# Patient Record
Sex: Female | Born: 1970 | Race: Black or African American | Hispanic: No | Marital: Single | Smoking: Never smoker
Health system: Southern US, Community
[De-identification: ages and names within clinical notes are randomized; demographics above are authoritative.]

## PROBLEM LIST (undated history)

## (undated) DIAGNOSIS — E7800 Pure hypercholesterolemia, unspecified: Secondary | ICD-10-CM

## (undated) DIAGNOSIS — G43909 Migraine, unspecified, not intractable, without status migrainosus: Secondary | ICD-10-CM

## (undated) DIAGNOSIS — J40 Bronchitis, not specified as acute or chronic: Secondary | ICD-10-CM

## (undated) DIAGNOSIS — E559 Vitamin D deficiency, unspecified: Principal | ICD-10-CM

## (undated) DIAGNOSIS — M545 Low back pain: Secondary | ICD-10-CM

## (undated) HISTORY — DX: Vitamin D deficiency, unspecified: E55.9

## (undated) HISTORY — DX: Migraine, unspecified, not intractable, without status migrainosus: G43.909

## (undated) HISTORY — DX: Low back pain: M54.5

## (undated) HISTORY — PX: ENDOMETRIAL ABLATION: SHX621

## (undated) HISTORY — PX: TUBAL LIGATION: SHX77

## (undated) HISTORY — PX: SHOULDER SURGERY: SHX246

---

## 1998-07-03 ENCOUNTER — Other Ambulatory Visit: Admission: RE | Admit: 1998-07-03 | Discharge: 1998-07-03 | Payer: Self-pay | Admitting: Gynecology

## 2001-01-05 ENCOUNTER — Ambulatory Visit (HOSPITAL_COMMUNITY): Admission: RE | Admit: 2001-01-05 | Discharge: 2001-01-05 | Payer: Self-pay | Admitting: *Deleted

## 2001-06-06 ENCOUNTER — Encounter: Payer: Self-pay | Admitting: Family Medicine

## 2001-06-06 ENCOUNTER — Ambulatory Visit (HOSPITAL_COMMUNITY): Admission: RE | Admit: 2001-06-06 | Discharge: 2001-06-06 | Payer: Self-pay | Admitting: Family Medicine

## 2002-03-22 ENCOUNTER — Emergency Department (HOSPITAL_COMMUNITY): Admission: EM | Admit: 2002-03-22 | Discharge: 2002-03-22 | Payer: Self-pay | Admitting: Internal Medicine

## 2003-06-26 ENCOUNTER — Emergency Department (HOSPITAL_COMMUNITY): Admission: EM | Admit: 2003-06-26 | Discharge: 2003-06-26 | Payer: Self-pay | Admitting: Emergency Medicine

## 2003-07-31 ENCOUNTER — Ambulatory Visit (HOSPITAL_COMMUNITY): Admission: RE | Admit: 2003-07-31 | Discharge: 2003-07-31 | Payer: Self-pay | Admitting: Otolaryngology

## 2004-02-13 ENCOUNTER — Ambulatory Visit (HOSPITAL_COMMUNITY): Admission: RE | Admit: 2004-02-13 | Discharge: 2004-02-13 | Payer: Self-pay | Admitting: *Deleted

## 2004-08-25 ENCOUNTER — Emergency Department: Payer: Self-pay | Admitting: Emergency Medicine

## 2005-07-05 ENCOUNTER — Emergency Department (HOSPITAL_COMMUNITY): Admission: EM | Admit: 2005-07-05 | Discharge: 2005-07-05 | Payer: Self-pay | Admitting: Emergency Medicine

## 2005-07-06 ENCOUNTER — Ambulatory Visit (HOSPITAL_COMMUNITY): Admission: RE | Admit: 2005-07-06 | Discharge: 2005-07-06 | Payer: Self-pay | Admitting: Family Medicine

## 2007-08-24 ENCOUNTER — Ambulatory Visit (HOSPITAL_COMMUNITY): Admission: RE | Admit: 2007-08-24 | Discharge: 2007-08-24 | Payer: Self-pay | Admitting: Obstetrics and Gynecology

## 2007-11-28 ENCOUNTER — Ambulatory Visit (HOSPITAL_COMMUNITY): Admission: RE | Admit: 2007-11-28 | Discharge: 2007-11-28 | Payer: Self-pay | Admitting: Family Medicine

## 2008-01-16 ENCOUNTER — Other Ambulatory Visit: Admission: RE | Admit: 2008-01-16 | Discharge: 2008-01-16 | Payer: Self-pay | Admitting: Obstetrics and Gynecology

## 2008-01-26 ENCOUNTER — Ambulatory Visit (HOSPITAL_COMMUNITY): Admission: RE | Admit: 2008-01-26 | Discharge: 2008-01-26 | Payer: Self-pay | Admitting: Obstetrics and Gynecology

## 2009-06-04 ENCOUNTER — Emergency Department (HOSPITAL_COMMUNITY): Admission: EM | Admit: 2009-06-04 | Discharge: 2009-06-04 | Payer: Self-pay | Admitting: Emergency Medicine

## 2010-06-28 LAB — RAPID STREP SCREEN (MED CTR MEBANE ONLY): Streptococcus, Group A Screen (Direct): NEGATIVE

## 2010-08-11 ENCOUNTER — Other Ambulatory Visit: Payer: Self-pay | Admitting: Obstetrics and Gynecology

## 2010-08-11 DIAGNOSIS — Z139 Encounter for screening, unspecified: Secondary | ICD-10-CM

## 2010-08-18 ENCOUNTER — Encounter (HOSPITAL_COMMUNITY): Payer: Self-pay

## 2010-08-18 NOTE — H&P (Signed)
NAME:  Katherine [name], Katherine [name]                ACCOUNT NO.:  [id number]   MEDICAL RECORD NO.:  [id number]          PATIENT TYPE:  AMB   LOCATION:  DAY                           FACILITY:  [facility]   PHYSICIAN:  Tilda Burrow, M.D. DATE OF BIRTH:  1970/05/26   DATE OF ADMISSION:  DATE OF DISCHARGE:  LH                              HISTORY & PHYSICAL   ADMITTING DIAGNOSIS:  Desire for elective permanent sterilization.   HISTORY OF PRESENT ILLNESS:  This 40 year old female who was seen in our  office for care in May 2009 is requesting elective permanent  sterilization.  She is a former patient of Dr. Dorinda Hill P. Lisette Grinder, who  has had routine uncomplicated GYN care.  She has had irregular menses,  skipping months at a time.  Recently, she had a period that was  uncomfortable and her worst period ever.  She is infrequently sexually  active.  She has decided that she wishes to proceed with permanent  sterilization at this time.   PAST MEDICAL HISTORY:  None.   PAST SURGICAL HISTORY:  Negative.   OBSTETRIC HISTORY:  Gravida 1, para 1.   MEDICATIONS:  Lo/Ovral.   ALLERGIES:  None.   FAMILY HISTORY:  She is single.  She is a niece of Joslyn Devon [facility]  [place]).   PHYSICAL EXAMINATION:  Reveals a largely muscular African American  female, alert, and oriented x3.  Pupils are equal, round, and reactive.  Extraocular movements intact.  NECK:  Supple.  CARDIOVASCULAR:  Unremarkable.  ABDOMEN:  Nontender without masses.  EXTERNAL GENITALIA:  Multiparous without lesions or abnormalities.  Cervix normal.  Uterus, nontender and mobile.  No adnexal tenderness or  masses.  EXTREMITY:  Grossly normal.   ASSESSMENT:  Elective permanent sterilization.   PLAN:  Laparoscopic tubal sterilization, Falope-Rings on Aug 24, 2007.   ADDENDUM:  The patient's work is physically demanding and she will need  a note stating when she can return to work.  Tentatively, we will  schedule it a week away, for Tuesday  after [facility] Day, Aug 30, 2007, 5  days after surgery.      Tilda Burrow, M.D.  Electronically Signed     JVF/MEDQ  D:  08/22/2007  T:  08/23/2007  Job:  [id number]

## 2010-08-18 NOTE — Op Note (Signed)
Katherine [name], Katherine [name]                ACCOUNT NO.:  [id number]   MEDICAL RECORD NO.:  [id number]          PATIENT TYPE:  AMB   LOCATION:  DAY                           FACILITY:  [facility]   PHYSICIAN:  Tilda Burrow, M.D. DATE OF BIRTH:  Oct 22, 1970   DATE OF PROCEDURE:  01/26/2008  DATE OF DISCHARGE:  01/26/2008                               OPERATIVE REPORT   PREOPERATIVE DIAGNOSIS:  Dysmenorrhea.   POSTOPERATIVE DIAGNOSES:  Dysmenorrhea and cervical stenosis.   PROCEDURE:  Hysteroscopy, dilatation and curettage, and endometrial  ablation.   SURGEON:  Tilda Burrow, MD   ASSISTANT:  None.   ANESTHESIA:  General.   FINDINGS:  Stenotic cervix, short and small uterine size.   ESTIMATED BLOOD LOSS:  Minimal.   SPECIMEN:  None.   INDICATIONS:  A 40 year old female who is recently status post tubal  ligation with debilitating dysmenorrhea attributable to the cervical  stenosis, status post conization, worsened by the fact that we have now  tied her tubes.  She is no longer on birth control pills and therefore  having heavier menstrual flow.   DETAILS OF PROCEDURE:  The patient was taken to the operating room,  prepped and draped for vaginal procedure.  The cervix was grasped with a  single-tooth tenaculum and the uterus sounded to 7 cm.  Cervix was  dilated with some moderate difficulty due to stenosis to 23-French  allowing introduction of a rigid 30-degree hysteroscope, which confirmed  a smooth endometrial cavity contour.  Smooth and sharp curettage was  performed obtaining a small amount of tissue, not enough to give a  specimen.  The repeat hysteroscopy revealed that both tubal ostia could  be visualized.  There was no suspicion of perforation.   Gynecare ThermaChoice III endometrial ablation device was then prepped  and inserted into the endometrial cavity and the 8 minute thermal  sequence completed without difficulty.  The fluid was all returned  without any loss,  and the patient then went to recovery room in good  condition after completion of procedure and Marcaine paracervical block  applied.  Sponge and needle counts were correct.      Tilda Burrow, M.D.  Electronically Signed     JVF/MEDQ  D:  02/02/2008  T:  02/03/2008  Job:  [id number]

## 2010-08-18 NOTE — H&P (Signed)
NAME:  Katherine [name], Katherine [name]                ACCOUNT NO.:  [id number]   MEDICAL RECORD NO.:  [id number]          PATIENT TYPE:  AMB   LOCATION:  DAY                           FACILITY:  [facility]   PHYSICIAN:  Tilda Burrow, M.D. DATE OF BIRTH:  09-10-1970   DATE OF ADMISSION:  DATE OF DISCHARGE:  LH                              HISTORY & PHYSICAL   PREOPERATIVE DIAGNOSES:  1. Menorrhagia.  2. Dysmenorrhea.   POSTOPERATIVE DIAGNOSES:  1. Menorrhagia.  2. Dysmenorrhea.   HISTORY OF PRESENT ILLNESS:  This 40 year old female only a few months'  status post tubal ligation returned complaining of debilitating  dysmenorrhea or the menses, which are lasting only about 5 days.  This  is interfering with her work.  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  She is aware of the 90% success  rate and 10% failure rate with endometrial ablation.   PAST MEDICAL HISTORY:  Essentially unchanged from her recent tubal  ligation.   REVIEW OF SYSTEMS:  Positive for chronic postprandial nausea and lactose  intolerance.   PAST MEDICAL HISTORY:  Benign.   PAST SURGICAL HISTORY:  Conization of the cervix.   Gravida 1, para 1, status post tubal ligation, single.   PHYSICAL EXAMINATION:  GENERAL:  Weight 180.4, blood pressure 110/72.  HEENT:  Pupils equal, round, and reactive.  Extraocular movements  intact.  NECK:  Supple.  Chest:  Clear to auscultation.  ABDOMEN:  Nontender.  PELVIC:  External genitalia with multiparous cervix status post laser.  PAP smear performed.   PLAN:  Hysteroscopy, dilation and curettage, endometrial ablation on  January 26, 2008 at 7:30 a.m. at [facility].      Tilda Burrow, M.D.  Electronically Signed     JVF/MEDQ  D:  01/24/2008  T:  01/25/2008  Job:  [id number]

## 2010-08-18 NOTE — Op Note (Signed)
NAME:  Katherine [name], Katherine [name]                ACCOUNT NO.:  [id number]   MEDICAL RECORD NO.:  [id number]          PATIENT TYPE:  AMB   LOCATION:  DAY                           FACILITY:  [facility]   PHYSICIAN:  Tilda Burrow, M.D. DATE OF BIRTH:  [birth year]   DATE OF PROCEDURE:  08/24/2007  DATE OF DISCHARGE:                               OPERATIVE REPORT   PREOPERATIVE DIAGNOSIS:  Elective sterilization.   POSTOPERATIVE DIAGNOSIS:  Elective sterilization.   PROCEDURE:  Laparoscopic tubal sterilization with Falope rings.   SURGEON:  Tilda Burrow, MD   ASSISTANT:  None.   ANESTHESIA:  General.   COMPLICATIONS:  None.   FINDINGS:  Normal tubes and ovaries.  Photo documentation performed.   DETAILS OF PROCEDURE:  The patient was taken to the operating room,  prepped and draped in the usual standard fashion with legs in low  lithotomy leg supports after general anesthesia was introduced without  difficulty.  The bladder was in-and-out catheterized and Hulka tenaculum  attached to the cervix for uterine manipulation.  An infraumbilical,  vertical, 1-cm skin incision was made as well as a transverse suprapubic  1-cm incision.  A Veress needle was used to achieve pneumoperitoneum  through the umbilical incision while being careful to orient the needle  toward the pelvis while elevating the abdominal wall by manual  elevation.  Water droplet test was used to confirm intraperitoneal  placement.   Pneumoperitoneum was achieved easily under 8-to-10 mm of intra-abdominal  pressure; and the laparoscopic trocar was introduced, a 5-mm blunt  tipped trocar, under direct visualization using the video camera.  Peritoneal cavity was entered without difficulty.  Inspection of the  anterior surfaces of the abdominal contents showed no evidence of injury  or bleeding.  Attention was directed to the pelvis.  Findings were as  described above.   Attention was first directed to the left fallopian tube  which was  elevated, identified to its fimbriated end and grasped in its midportion  with Falope ring applier.  Falope ring applied and then the tube  infiltrated with Marcaine solution 0.25% using a 22-gauge spinal needle  percutaneously applied.   Attention was then directed to the right fallopian tube where a similar  procedure was performed.  Photo documentation of the ring placements was  performed; 120 cc of saline was instilled into the abdomen; deflation of  CO2 performed; instruments removed and subcuticular 4-0 Dexon closure of  skin incisions performed.  The rest of the surgical instruments were  removed; Steri-Strips placed.  The patient allowed to awaken and go to  recovery room in standard fashion.      Tilda Burrow, M.D.  Electronically Signed     JVF/MEDQ  D:  08/24/2007  T:  08/25/2007  Job:  [id number]

## 2010-08-21 NOTE — Op Note (Signed)
[facility]  Patient:    Katherine [name], Katherine [name] Visit Number: [id number] MRN: [id number]          Service Type: DSU Location: DAY Attending Physician:  Jeri Cos. Dictated by:   Langley Gauss, M.D. Proc. Date: 01/05/01 Admit Date:  01/05/2001 Discharge Date: 01/05/2001                             Operative Report  PREOPERATIVE DIAGNOSIS:  Abnormal uterine bleeding despite continuous oral contraceptives.  POSTOPERATIVE DIAGNOSIS:  Abnormal uterine bleeding despite continuous oral contraceptives.  PROCEDURE PERFORMED: 1. Hysteroscopy. 2. Dilatation and curettage.  SURGEON:  Langley Gauss, M.D.  COMPLICATIONS:  None.  SPECIMENS:  Separate specimens obtained, #1 from the hysteroscopy and then #2 is uterine curettings obtained during dilatation and curettage.  ESTIMATED BLOOD LOSS:  Less than 500 cc.  ANESTHESIA:  General endotracheal.  FINDINGS:  Findings at time of surgery include enlarged uterus sounding to a depth of 10 cm.  Present at the uterine fundus is very spongy-appearing tissue with no evidence of hemorrhage.  Portion of this is removed utilizing the biopsy forceps through the hysteroscope.  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  There is also noted to be what appears to be endocervical polyps which likewise should have been removed during the dilatation and curettage.  DESCRIPTION OF PROCEDURE:  Patient was taken to the operating room.  Vital signs were stable.  Patient underwent an uncomplicated induction of general endotracheal anesthesia, after which time she was placed in the full lithotomy position.  She was prepped and draped in the usual sterile manner.  Red rubber catheter was used to drain about 50 cc of clear-yellow urine from the bladder. A speculum was placed within the vaginal vault.  Cervix  was visualized. Calgon solution is used to sterilely prep the cervix directly.  The uterus, as stated previously, is noted to sound to a depth of about 10 cm.  Initial resistance was encountered in the lower uterine segment which released with gentle pressure.  There was no evidence of any uterine perforation during any portion of the operative procedure.  As stated previously, uterus is noted to sound to a depth of 10 cm.  Gentle progressive dilatation is then performed up to a size #16 dilator, which allows passage of the hysteroscope and sleeve through the endocervical os and into the uterine cavity itself.  Careful inspection of the entire uterine cavity is performed at this time.  As stated previously, several small endocervical polyps are visualized which are photographed.  Likewise, the spongy-appearing non-hemorrhagic tissue at the uterine fundus is photographed.  A portion of this spongy-appearing tissue is then excised utilizing the hysteroscopic biopsy forceps.  This is handed off for separate specimen.  The remainder of the intrauterine contour appears normal with no evidence of additional synechiae, nor are there any septations encountered nor any irregularities which would be consistent with any fibroid disease, thus the hysteroscopic portion of the procedure is terminated.  The hysteroscope is removed.  With the cervix already being dilated from the hysteroscopic procedure, the small banjo curette is passed through the endocervix into the uterine cavity.  Curettage is then performed of the entire uterine cavity to include the uterine fundus.  Specific attention is made to the area of the uterine fundus where the spongy-appearing tissue had  been visualized through the hysteroscope.  Curettage is continued in all four walls of the uterus as well as the uterine fundus until a fine gritty sensation is appreciated in all areas of the uterine wall.  A moderate amount of  what appears to be normal tissue is obtained and no hemorrhage is encountered. There is no evidence of any septations within the uterus or any further synechiae encountered.  The specimen is then handed off for a permanent specimen only.  The procedure is then terminated.  Procedure was tolerated very well by the patient.  Patient was reversed of anesthesia and taken to recovery room in stable condition.  Operative findings discussed with the patients awaiting family.  PLAN:  Discharge the patient today on outpatient surgical status. Dictated by:   Langley Gauss, M.D. Attending Physician:  Jeri Cos. DD:  01/10/01 TD:  01/11/01 Job: [id number]

## 2010-08-21 NOTE — H&P (Signed)
[facility]  Patient:    Katherine [name], Katherine [name] Visit Number: [id number] MRN: [id number]          Service Type: Attending:  Langley Gauss, M.D. Dictated by:   Langley Gauss, M.D. Adm. Date:  01/05/01                           History and Physical  REASON FOR ADMISSION:  Patient admitted for [facility] and hysteroscopy for evaluation of abnormal uterine bleeding.  HISTORY OF PRESENT ILLNESS:  The patient is a 40 year old gravida 1, para 1 whose is a postpartum OB on June 17, 2000.  The delivery itself was uncomplicated.  Patient states that since delivery of that infant, she has had problems with irregular menstrual period; most specifically, she has had passage of small blood clots and intermittent spotting.  Patient does state that most of the other times of the month, she has persistent brownish discharge.  She has been treated with multiple courses of antibiotics, most specifically Flagyl and MetroGel; likewise, she has been changed on birth control pills, has taken Lo/Ovral most recently.  Despite taking the pills on a daily basis at the same time, she has had problems with the irregular brown discharge.  Cultures have likewise been negative.  PAST MEDICAL HISTORY:  Patient had previous upper GI study and does experience intermittent reflux symptoms.  Patient does have a history of laser surgery performed on the cervix in 1996 or 1997.  Patient is a carrier of a sickle cell trait.  Patient is postpartum OB on June 16, 2000, no postpartum endometritis encountered.  ALLERGIES:  Patient has no known drug allergies.  CURRENT MEDICATIONS:  Lo/Ovral on a daily basis.  Patient has recently completed a course of Flagyl.  PHYSICAL EXAMINATION:  GENERAL:  Very healthy, pleasant black female in no acute distress.  VITAL SIGNS:  Blood pressure 122/82.  Weight is 160 pounds.  HEENT:  Negative.  No adenopathy.  NECK:  Supple.  Thyroid is nonpalpable.  LUNGS:   Clear.  CARDIOVASCULAR:  Regular rate and rhythm.  ABDOMEN:  Soft and nontender.  No surgical scars are identified.  EXTREMITIES:  Normal.  PELVIC:  Normal external genitalia.  The cervix is visualized and noted to be without lesions.  There is a brownish mucusy discharge present at the endocervical os.  No evidence of any cervicitis.  Bimanual examination reveals a normal-size uterus which is retroflexed.  There are no adnexal masses.  The uterus and adnexa are palpably normal and nontender.  LABORATORY STUDIES:  A transvaginal ultrasound is performed which does reveal a thickened, hypoechogenic material within the endometrial cavity with a maximum diameter of 0.73 cm.  The ovaries are otherwise visualized and noted to be normal in appearance, measured at 2.98 cm and 1.98 cm on each side.  ASSESSMENT:  Patient with difficulty with abnormal uterine bleeding and most specifically a malodorous persistent brown vaginal discharge following delivery.  She has failed to have significant improvement on birth control pills, despite taking them as prescribed.  She has had some relief from the odor associated with the discharge after just recently completing a course of p.o. Flagyl, however, the brown discharge does persist and is somewhat disconcerting to the patient; likewise, culture for a sexually transmitted disease has been negative.  Impression is possible chronic endometriosis but in consideration of the abnormal appearance of the ultrasound with the thickening of the endometrial stripe, the patient has a good possibility  of a placental polyp or some chronic retained placental products.  Thus, at this point in time, patient is referred for dilatation and curettage and hysteroscopy for complete evaluation of the uterine cavity.  Risks and benefits of the procedure are discussed with the patient, specifically the risks associated with the anesthesia; also, patient accepts that there  are risks associated with possible perforation of the uterus or chronic infection persisting following operative procedure. Dictated by:   Langley Gauss, M.D. Attending:  Langley Gauss, M.D. DD:  01/04/01 TD:  01/05/01 Job: [id number]

## 2010-08-21 NOTE — Discharge Summary (Signed)
[facility]  Patient:    [name], Katherine Visit Number: [id number] MRN: [id number]          Service Type: DSU Location: DAY Attending Physician:  Jeri Cos. Dictated by:   Langley Gauss, M.D. Admit Date:  01/05/2001 Discharge Date: 01/05/2001                             Discharge Summary  DISPOSITION:  Patient is given a copy of standardized discharge instructions. Follow up in the office in one weeks time.  DISCHARGE MEDICATIONS:  Doxycycline 100 mg p.o. b.i.d. x 7 days, likewise, patient received 1 g of IV Ancef intraoperatively, patient also given a prescription for Vicoprofen #20 with no refill, to be taken 1 q.6h. p.r.n. for postoperative uterine cramping.  PERTINENT LABORATORY STUDIES:  HCG negative.  B-positive blood type. Hemoglobin 12.6, hematocrit 35.9.  Electrolytes within normal limits.  HOSPITAL COURSE:  Patient processed through the outpatient surgical unit on January 05, 2001, taken to the operating room where the dilatation and curettage and hysteroscopy were performed without complications.  Patient did well postoperatively with no postoperative complications, had minimal vaginal bleeding, patient thus discharged home on January 05, 2001.  She will continue with the oral contraceptives and follow up in the office in one weeks time, at which time we should have pathology results available and can discuss additional operative findings with the patient. Dictated by:   Langley Gauss, M.D. Attending Physician:  Jeri Cos. DD:  01/10/01 TD:  01/11/01 Job: [id number]

## 2010-08-25 ENCOUNTER — Ambulatory Visit (HOSPITAL_COMMUNITY)
Admission: RE | Admit: 2010-08-25 | Discharge: 2010-08-25 | Disposition: A | Discharge status: Home / Self Care | Payer: Self-pay | Source: Ambulatory Visit | Attending: Obstetrics and Gynecology | Admitting: Obstetrics and Gynecology

## 2010-08-25 DIAGNOSIS — Z139 Encounter for screening, unspecified: Secondary | ICD-10-CM

## 2010-09-23 ENCOUNTER — Other Ambulatory Visit (HOSPITAL_COMMUNITY)
Admission: RE | Admit: 2010-09-23 | Discharge: 2010-09-23 | Disposition: A | Discharge status: Home / Self Care | Payer: Self-pay | Source: Ambulatory Visit | Attending: Obstetrics and Gynecology | Admitting: Obstetrics and Gynecology

## 2010-09-23 ENCOUNTER — Other Ambulatory Visit: Payer: Self-pay | Admitting: Obstetrics and Gynecology

## 2010-09-23 DIAGNOSIS — Z01419 Encounter for gynecological examination (general) (routine) without abnormal findings: Secondary | ICD-10-CM | POA: Insufficient documentation

## 2010-12-30 LAB — BASIC METABOLIC PANEL
BUN: 7
Calcium: 9.7
Chloride: 105
Creatinine, Ser: 1.09
GFR calc Af Amer: >60
GFR calc non Af Amer: 56 — ABNORMAL LOW
Sodium: 137

## 2010-12-30 LAB — CBC: MCV: 88.9

## 2011-01-04 LAB — CBC
Hemoglobin: 12.3
Platelets: 251
WBC: 6.9

## 2011-01-04 LAB — HCG, QUANTITATIVE, PREGNANCY: hCG, Beta Chain, Quant, S: 2

## 2011-09-15 ENCOUNTER — Other Ambulatory Visit (HOSPITAL_COMMUNITY): Payer: Self-pay | Admitting: Family Medicine

## 2011-09-15 DIAGNOSIS — Z139 Encounter for screening, unspecified: Secondary | ICD-10-CM

## 2011-09-20 ENCOUNTER — Other Ambulatory Visit (HOSPITAL_COMMUNITY): Payer: Self-pay | Admitting: Family Medicine

## 2011-09-20 DIAGNOSIS — Z139 Encounter for screening, unspecified: Secondary | ICD-10-CM

## 2011-09-21 ENCOUNTER — Ambulatory Visit (HOSPITAL_COMMUNITY)
Admission: RE | Admit: 2011-09-21 | Discharge: 2011-09-21 | Disposition: A | Discharge status: Home / Self Care | Payer: Medicaid Other | Source: Ambulatory Visit | Attending: Family Medicine | Admitting: Family Medicine

## 2011-09-21 DIAGNOSIS — Z1231 Encounter for screening mammogram for malignant neoplasm of breast: Secondary | ICD-10-CM | POA: Insufficient documentation

## 2011-09-21 DIAGNOSIS — Z139 Encounter for screening, unspecified: Secondary | ICD-10-CM

## 2012-01-15 ENCOUNTER — Emergency Department (HOSPITAL_COMMUNITY)
Admission: EM | Admit: 2012-01-15 | Discharge: 2012-01-15 | Disposition: A | Discharge status: Home / Self Care | Payer: Medicaid Other | Attending: Emergency Medicine | Admitting: Emergency Medicine

## 2012-01-15 ENCOUNTER — Emergency Department (HOSPITAL_COMMUNITY): Payer: Medicaid Other

## 2012-01-15 ENCOUNTER — Encounter (HOSPITAL_COMMUNITY): Payer: Self-pay | Admitting: *Deleted

## 2012-01-15 DIAGNOSIS — R0789 Other chest pain: Secondary | ICD-10-CM

## 2012-01-15 DIAGNOSIS — J40 Bronchitis, not specified as acute or chronic: Secondary | ICD-10-CM

## 2012-01-15 DIAGNOSIS — J189 Pneumonia, unspecified organism: Secondary | ICD-10-CM | POA: Insufficient documentation

## 2012-01-15 HISTORY — DX: Bronchitis, not specified as acute or chronic: J40

## 2012-01-15 MED ORDER — HYDROCODONE-ACETAMINOPHEN 5-325 MG PO TABS: 1.0000 | ORAL_TABLET | Freq: Four times a day (QID) | ORAL | Status: AC | PRN

## 2012-01-15 MED ORDER — ALBUTEROL SULFATE HFA 108 (90 BASE) MCG/ACT IN AERS: 1.0000 | INHALATION_SPRAY | Freq: Four times a day (QID) | RESPIRATORY_TRACT | Status: DC | PRN

## 2012-01-15 MED ORDER — NAPROXEN 500 MG PO TABS: 500.0000 mg | ORAL_TABLET | Freq: Two times a day (BID) | ORAL | Status: DC

## 2012-01-15 MED ORDER — HYDROCODONE-ACETAMINOPHEN 5-325 MG PO TABS
1.0000 | ORAL_TABLET | Freq: Once | ORAL | Status: AC
  Administered 2012-01-15: 1 via ORAL
  Filled 2012-01-15: qty 1

## 2012-01-15 MED ORDER — MUCINEX DM 30-600 MG PO TB12: 1.0000 | ORAL_TABLET | Freq: Two times a day (BID) | ORAL | Status: DC

## 2012-01-15 MED ORDER — AZITHROMYCIN 250 MG PO TABS: 250.0000 mg | ORAL_TABLET | Freq: Every day | ORAL | Status: DC

## 2012-01-15 NOTE — ED Notes (Signed)
Pt with SOB with exertion.

## 2012-01-15 NOTE — ED Notes (Signed)
Pt with with cough, chest congestion for 2 weeks, productive cough of green phlegm and blood noted x 1, harder to breathe per pt, fever x 1 per pt

## 2012-01-15 NOTE — ED Provider Notes (Signed)
History  This chart was scribed for Katherine Jakes, MD by Shari Heritage. The patient was seen in room APA07/APA07. Patient's care was started at 1315.    CSN: [id number]  Arrival date & time 01/15/12  1245   First MD Initiated Contact with Patient 01/15/12 1315      Chief Complaint  Patient presents with  . Cough  . Nasal Congestion  . Chest Pain     Patient is a 41 y.o. female presenting with cough. The history is provided by the patient. No language interpreter was used.  Cough The current episode started more than 1 week ago. The problem occurs constantly. The cough is productive of sputum (green with streaks of blood). There has been no fever. Associated symptoms include chest pain. Pertinent negatives include no headaches. She has tried cough syrup for the symptoms. Her past medical history does not include asthma.    HPI Comments: Katherine [name] is a 41 y.o. female who presents to the Emergency Department complaining of productive cough onset 2 weeks ago and sudden, moderate, constant right-sided chest pain that began last night. Patient states that she has been coughing up green phlegm with some streaks of blood. There is associated chest congestion and fever x1 (1 week ago). Patient denies other symptoms at this time. Patient hasn't sought any other treatment for her symptoms. She has taken Alka Seltzer Cold and Robitussin with no relief. Patient hasn't taken Mucinex DM, Motrin or Naprosyn for pain relief. Patient has no history of asthma. Patient had shoulder surgery 3 weeks ago. Patient has never smoked.  PCP - Renard Matter   Past Medical History  Diagnosis Date  . Bronchitis     Past Surgical History  Procedure Date  . Shoulder surgery     No family history on file.  History  Substance Use Topics  . Smoking status: Never Smoker   . Smokeless tobacco: Not on file  . Alcohol Use: No    OB History    Grav Para Term Preterm Abortions TAB SAB Ect Mult Living               Review of Systems  Constitutional: Positive for fever (1 week ago).  HENT: Positive for congestion.   Eyes: Negative for visual disturbance.  Respiratory: Positive for cough.   Cardiovascular: Positive for chest pain.  Gastrointestinal: Negative for abdominal pain.  Genitourinary: Negative for difficulty urinating.  Musculoskeletal: Negative for back pain.  Skin: Negative for rash.  Neurological: Negative for headaches.    Allergies  Review of patient's allergies indicates no known allergies.  Home Medications   Current Outpatient Rx  Name Route Sig Dispense Refill  . PHENYLEPH-CPM-DM-ASPIRIN 7.11-04-08-325 MG PO TBEF Oral Take 2 tablets by mouth every 6 (six) hours as needed.    . ALBUTEROL SULFATE HFA 108 (90 BASE) MCG/ACT IN AERS Inhalation Inhale 1-2 puffs into the lungs every 6 (six) hours as needed for wheezing. 1 Inhaler 0  . AZITHROMYCIN 250 MG PO TABS Oral Take 1 tablet (250 mg total) by mouth daily. Take first 2 tablets together, then 1 every day until finished. 6 tablet 0  . MUCINEX DM 30-600 MG PO TB12 Oral Take 1 tablet by mouth 2 (two) times daily. 14 each 0  . HYDROCODONE-ACETAMINOPHEN 5-325 MG PO TABS Oral Take 1-2 tablets by mouth every 6 (six) hours as needed for pain. 14 tablet 0  . NAPROXEN 500 MG PO TABS Oral Take 1 tablet (500 mg total) by mouth  2 (two) times daily. 14 tablet 0    BP 112/85  Pulse 78  Temp 98.7 F (37.1 C) (Oral)  Resp 20  Ht 5\' 7"  (1.702 m)  Wt 190 lb (86.183 kg)  BMI 29.76 kg/m2  SpO2 100%  Physical Exam  Constitutional: She is oriented to person, place, and time. She appears well-developed and well-nourished.  HENT:  Head: Normocephalic and atraumatic.  Mouth/Throat: Oropharynx is clear and moist and mucous membranes are normal.       Throat is clear. Mucous membranes are moist.  Eyes: EOM are normal. Pupils are equal, round, and reactive to light.  Neck: Normal range of motion. Neck supple.  Cardiovascular: Normal  rate and regular rhythm.   No murmur heard. Pulmonary/Chest: Effort normal and breath sounds normal. No respiratory distress. She has no wheezes. She has no rales.       Tender to palpation under the right rest on the ribs.  Abdominal: She exhibits no distension. There is no rebound.  Musculoskeletal: Normal range of motion.  Lymphadenopathy:    She has no cervical adenopathy.  Neurological: She is alert and oriented to person, place, and time. She has normal strength. No sensory deficit. Coordination normal.  Skin: Skin is warm and dry.  Psychiatric: She has a normal mood and affect. Her behavior is normal.    ED Course  Procedures (including critical care time) DIAGNOSTIC STUDIES: Oxygen Saturation is 100% on room air, normal by my interpretation.    COORDINATION OF CARE: 1:54pm- Patient informed of current plan for treatment and evaluation and agrees with plan at this time.    Labs Reviewed - No data to display  Dg Chest 2 View  01/15/2012  *RADIOLOGY REPORT*  Clinical Data: Productive cough.  Fever.  CHEST - 2 VIEW  Comparison: None.  Findings: Pulmonary air space disease is seen in the superior segment of the left lower lobe, consistent with pneumonia.  The right lung is clear.  No evidence of pleural effusion.  Heart size is normal.  No hilar or mediastinal masses identified.  IMPRESSION: The superior left lower lobe airspace disease, consistent with pneumonia.   Original Report Authenticated By: Danae Orleans, M.D.      1. Community acquired pneumonia   2. Bronchitis   3. Chest wall pain       MDM  Chest x-rays consistent with a left-sided pneumonia patient with right-sided chest wall pain probably due to the cough suspect symptoms started out as a viral bronchitis patient does have a persistent cough. Not febrile currently no evidence of pneumothorax we'll treat with Zithromax for the left-sided community-acquired pneumonia we'll treat the chest wall pain now with  hydrocodone and Naprosyn also for the cough have the patient use albuterol inhaler and Mucinex DM. He and      I personally performed the services described in this documentation, which was scribed in my presence. The recorded information has been reviewed and considered.     Katherine Jakes, MD 01/15/12 [phone]

## 2012-01-20 ENCOUNTER — Other Ambulatory Visit (HOSPITAL_COMMUNITY): Payer: Self-pay | Admitting: Family Medicine

## 2012-01-20 ENCOUNTER — Ambulatory Visit (HOSPITAL_COMMUNITY)
Admission: RE | Admit: 2012-01-20 | Discharge: 2012-01-20 | Disposition: A | Discharge status: Home / Self Care | Payer: Medicaid Other | Source: Ambulatory Visit | Attending: Family Medicine | Admitting: Family Medicine

## 2012-01-20 DIAGNOSIS — R05 Cough: Secondary | ICD-10-CM | POA: Insufficient documentation

## 2012-01-20 DIAGNOSIS — R059 Cough, unspecified: Secondary | ICD-10-CM | POA: Insufficient documentation

## 2012-01-20 DIAGNOSIS — J189 Pneumonia, unspecified organism: Secondary | ICD-10-CM

## 2012-01-27 ENCOUNTER — Ambulatory Visit (HOSPITAL_COMMUNITY)
Admission: RE | Admit: 2012-01-27 | Discharge: 2012-01-27 | Disposition: A | Discharge status: Home / Self Care | Payer: Medicaid Other | Source: Ambulatory Visit | Attending: Family Medicine | Admitting: Family Medicine

## 2012-01-27 ENCOUNTER — Other Ambulatory Visit (HOSPITAL_COMMUNITY): Payer: Self-pay | Admitting: Family Medicine

## 2012-01-27 DIAGNOSIS — J189 Pneumonia, unspecified organism: Secondary | ICD-10-CM

## 2012-02-11 ENCOUNTER — Other Ambulatory Visit (HOSPITAL_COMMUNITY): Payer: Self-pay | Admitting: Family Medicine

## 2012-02-11 ENCOUNTER — Ambulatory Visit (HOSPITAL_COMMUNITY)
Admission: RE | Admit: 2012-02-11 | Discharge: 2012-02-11 | Disposition: A | Discharge status: Home / Self Care | Payer: Medicaid Other | Source: Ambulatory Visit | Attending: Family Medicine | Admitting: Family Medicine

## 2012-02-11 DIAGNOSIS — J189 Pneumonia, unspecified organism: Secondary | ICD-10-CM

## 2012-07-26 ENCOUNTER — Other Ambulatory Visit (HOSPITAL_COMMUNITY): Payer: Self-pay | Admitting: Family Medicine

## 2012-07-26 ENCOUNTER — Ambulatory Visit (HOSPITAL_COMMUNITY)
Admission: RE | Admit: 2012-07-26 | Discharge: 2012-07-26 | Disposition: A | Discharge status: Home / Self Care | Payer: Medicaid Other | Source: Ambulatory Visit | Attending: Family Medicine | Admitting: Family Medicine

## 2012-07-26 DIAGNOSIS — A159 Respiratory tuberculosis unspecified: Secondary | ICD-10-CM

## 2012-07-26 DIAGNOSIS — Z111 Encounter for screening for respiratory tuberculosis: Secondary | ICD-10-CM | POA: Insufficient documentation

## 2013-01-31 ENCOUNTER — Other Ambulatory Visit (HOSPITAL_COMMUNITY): Payer: Self-pay | Admitting: Family Medicine

## 2013-01-31 DIAGNOSIS — Z139 Encounter for screening, unspecified: Secondary | ICD-10-CM

## 2013-02-09 ENCOUNTER — Ambulatory Visit (HOSPITAL_COMMUNITY)
Admission: RE | Admit: 2013-02-09 | Discharge: 2013-02-09 | Disposition: A | Discharge status: Home / Self Care | Payer: Medicaid Other | Source: Ambulatory Visit | Attending: Family Medicine | Admitting: Family Medicine

## 2013-02-09 DIAGNOSIS — Z139 Encounter for screening, unspecified: Secondary | ICD-10-CM

## 2013-02-09 DIAGNOSIS — Z1231 Encounter for screening mammogram for malignant neoplasm of breast: Secondary | ICD-10-CM | POA: Insufficient documentation

## 2013-04-24 ENCOUNTER — Ambulatory Visit (HOSPITAL_COMMUNITY)
Admission: RE | Admit: 2013-04-24 | Discharge: 2013-04-24 | Disposition: A | Discharge status: Home / Self Care | Payer: Medicaid Other | Source: Ambulatory Visit | Attending: Family Medicine | Admitting: Family Medicine

## 2013-04-24 ENCOUNTER — Other Ambulatory Visit (HOSPITAL_COMMUNITY): Payer: Self-pay | Admitting: Family Medicine

## 2013-04-24 DIAGNOSIS — M545 Low back pain, unspecified: Secondary | ICD-10-CM

## 2013-04-24 DIAGNOSIS — Z139 Encounter for screening, unspecified: Secondary | ICD-10-CM

## 2013-08-01 ENCOUNTER — Other Ambulatory Visit (HOSPITAL_COMMUNITY): Payer: Self-pay | Admitting: Family Medicine

## 2013-08-01 ENCOUNTER — Ambulatory Visit (HOSPITAL_COMMUNITY)
Admission: RE | Admit: 2013-08-01 | Discharge: 2013-08-01 | Disposition: A | Discharge status: Home / Self Care | Payer: Medicaid Other | Source: Ambulatory Visit | Attending: Family Medicine | Admitting: Family Medicine

## 2013-08-01 DIAGNOSIS — Z111 Encounter for screening for respiratory tuberculosis: Secondary | ICD-10-CM | POA: Insufficient documentation

## 2013-08-01 DIAGNOSIS — A159 Respiratory tuberculosis unspecified: Secondary | ICD-10-CM

## 2014-02-21 ENCOUNTER — Other Ambulatory Visit (HOSPITAL_COMMUNITY): Payer: Self-pay | Admitting: Family Medicine

## 2014-02-21 DIAGNOSIS — Z139 Encounter for screening, unspecified: Secondary | ICD-10-CM

## 2014-02-25 ENCOUNTER — Ambulatory Visit (HOSPITAL_COMMUNITY)
Admission: RE | Admit: 2014-02-25 | Discharge: 2014-02-25 | Disposition: A | Discharge status: Home / Self Care | Payer: Medicaid Other | Source: Ambulatory Visit | Attending: Family Medicine | Admitting: Family Medicine

## 2014-02-25 DIAGNOSIS — Z1231 Encounter for screening mammogram for malignant neoplasm of breast: Secondary | ICD-10-CM | POA: Insufficient documentation

## 2014-02-25 DIAGNOSIS — Z139 Encounter for screening, unspecified: Secondary | ICD-10-CM

## 2015-02-24 ENCOUNTER — Ambulatory Visit (HOSPITAL_COMMUNITY)
Admission: RE | Admit: 2015-02-24 | Discharge: 2015-02-24 | Disposition: A | Discharge status: Home / Self Care | Payer: Medicaid Other | Source: Ambulatory Visit | Attending: Family Medicine | Admitting: Family Medicine

## 2015-02-24 ENCOUNTER — Other Ambulatory Visit (HOSPITAL_COMMUNITY): Payer: Self-pay | Admitting: Family Medicine

## 2015-02-24 DIAGNOSIS — R110 Nausea: Secondary | ICD-10-CM

## 2015-02-24 DIAGNOSIS — R51 Headache: Secondary | ICD-10-CM | POA: Insufficient documentation

## 2015-02-24 DIAGNOSIS — Z8669 Personal history of other diseases of the nervous system and sense organs: Secondary | ICD-10-CM

## 2015-02-24 DIAGNOSIS — R519 Headache, unspecified: Secondary | ICD-10-CM

## 2015-02-25 ENCOUNTER — Other Ambulatory Visit (HOSPITAL_COMMUNITY): Payer: Self-pay | Admitting: Family Medicine

## 2015-02-25 DIAGNOSIS — Z1231 Encounter for screening mammogram for malignant neoplasm of breast: Secondary | ICD-10-CM

## 2015-03-03 ENCOUNTER — Ambulatory Visit (HOSPITAL_COMMUNITY)
Admission: RE | Admit: 2015-03-03 | Discharge: 2015-03-03 | Disposition: A | Discharge status: Home / Self Care | Payer: Medicaid Other | Source: Ambulatory Visit | Attending: Family Medicine | Admitting: Family Medicine

## 2015-03-03 DIAGNOSIS — Z1231 Encounter for screening mammogram for malignant neoplasm of breast: Secondary | ICD-10-CM | POA: Diagnosis present

## 2015-05-28 ENCOUNTER — Other Ambulatory Visit (HOSPITAL_COMMUNITY)
Admission: RE | Admit: 2015-05-28 | Discharge: 2015-05-28 | Disposition: A | Discharge status: Home / Self Care | Payer: Medicaid Other | Source: Ambulatory Visit | Attending: Adult Health | Admitting: Adult Health

## 2015-05-28 ENCOUNTER — Ambulatory Visit (INDEPENDENT_AMBULATORY_CARE_PROVIDER_SITE_OTHER): Payer: Medicaid Other | Admitting: Adult Health

## 2015-05-28 ENCOUNTER — Encounter: Payer: Self-pay | Admitting: Adult Health

## 2015-05-28 VITALS — BP 122/90 | HR 70 | Ht 67.0 in | Wt 198.0 lb

## 2015-05-28 DIAGNOSIS — Z01419 Encounter for gynecological examination (general) (routine) without abnormal findings: Secondary | ICD-10-CM | POA: Insufficient documentation

## 2015-05-28 DIAGNOSIS — Z124 Encounter for screening for malignant neoplasm of cervix: Secondary | ICD-10-CM

## 2015-05-28 DIAGNOSIS — Z1212 Encounter for screening for malignant neoplasm of rectum: Secondary | ICD-10-CM | POA: Diagnosis not present

## 2015-05-28 DIAGNOSIS — M545 Low back pain, unspecified: Secondary | ICD-10-CM

## 2015-05-28 DIAGNOSIS — Z1151 Encounter for screening for human papillomavirus (HPV): Secondary | ICD-10-CM | POA: Diagnosis present

## 2015-05-28 DIAGNOSIS — Z0000 Encounter for general adult medical examination without abnormal findings: Secondary | ICD-10-CM | POA: Diagnosis not present

## 2015-05-28 HISTORY — DX: Low back pain, unspecified: M54.50

## 2015-05-28 LAB — POCT URINALYSIS DIPSTICK
Blood, UA: NEGATIVE
GLUCOSE UA: NEGATIVE
LEUKOCYTES UA: NEGATIVE
Nitrite, UA: NEGATIVE
PROTEIN UA: NEGATIVE

## 2015-05-28 LAB — HEMOCCULT GUIAC POC 1CARD (OFFICE): FECAL OCCULT BLD: NEGATIVE

## 2015-05-28 MED ORDER — CYCLOBENZAPRINE HCL 5 MG PO TABS: 5.0000 mg | ORAL_TABLET | Freq: Three times a day (TID) | ORAL | Status: DC | PRN

## 2015-05-28 NOTE — Patient Instructions (Signed)
Try flexeril for muscle cramps Try ice Try  Stretches Mammogram yearly Colonoscopy at 49 Follow up with Dr Selena Batten on back

## 2015-05-28 NOTE — Progress Notes (Signed)
Patient ID: Katherine [name], female   DOB: [birth year], 45 y.o.   MRN: [id number] History of Present Illness: Katherine [name] is a 45 year old black female, G2P1 in for a well woman gyn exam and pap, and complains of cramping pain in left lower back, no known injury, works as Chief Operating Officer.She says it comes and goes. PCP is Dr Selena Batten.No periods, sp ablation and no sex since 2010.   Current Medications, Allergies, Past Medical History, Past Surgical History, Family History and Social History were reviewed in [employer] record.     Review of Systems: Patient denies any headaches, hearing loss, fatigue, blurred vision, shortness of breath, chest pain, abdominal pain, problems with bowel movements, urination, or intercourse(not having sex). No joint pain or mood swings. See HPI for positives.   Physical Exam:BP 122/90 mmHg  Pulse 70  Ht  (1.702 m)  Wt 198 lb (89.812 kg)  BMI 31.00 kg/m2 urine negative General:  Well developed, well nourished, no acute distress Skin:  Warm and dry Neck:  Midline trachea, normal thyroid, good ROM, no lymphadenopathy Lungs; Clear to auscultation bilaterally Breast:  No dominant palpable mass, retraction, or nipple discharge Cardiovascular: Regular rate and rhythm Abdomen:  Soft, non tender, no hepatosplenomegaly Pelvic:  External genitalia is normal in appearance, no lesions.  The vagina is normal in appearance. Urethra has no lesions or masses. The cervix is bulbous, pap with HPV performed.  Uterus is felt to be normal size, shape, and contour.  No adnexal masses or tenderness noted.Bladder is non tender, no masses felt.No CVAT, but has pain left lower back like muscle spasm, when palpated Rectal: Good sphincter tone, no polyps, or hemorrhoids felt.  Hemoccult negative. Extremities/musculoskeletal:  No swelling or varicosities noted, no clubbing or cyanosis Psych:  No mood changes, alert and cooperative,seems happy   Impression: Well woman gyn  exam and pap Left lower back pain    Plan: Rx flexeril 5 mg #30 take 1 every 8 hours prn muscle cramps, no refills Try ice and stretches and tennis ball, Follow up with Dr Selena Batten if not better  Check CBC,CMP,TSH and lipids,A1c and vitamin D Physical in 1 year, pap in 3 if normal Mammogram yearly Colonosocpy at 49

## 2015-05-29 LAB — COMPREHENSIVE METABOLIC PANEL
ALK PHOS: 109 IU/L (ref 39–117)
ALT: 20 IU/L (ref 0–32)
AST: 18 IU/L (ref 0–40)
Albumin/Globulin Ratio: 1.7 (ref 1.1–2.5)
Albumin: 4.3 g/dL (ref 3.5–5.5)
BILIRUBIN TOTAL: 0.3 mg/dL (ref 0.0–1.2)
BUN / CREAT RATIO: 7 — AB (ref 9–23)
BUN: 7 mg/dL (ref 6–24)
CHLORIDE: 104 mmol/L (ref 96–106)
CO2: 23 mmol/L (ref 18–29)
CREATININE: 0.94 mg/dL (ref 0.57–1.00)
Calcium: 9.4 mg/dL (ref 8.7–10.2)
GFR calc Af Amer: 85 mL/min/{1.73_m2} (ref >59)
GFR calc non Af Amer: 74 mL/min/{1.73_m2} (ref >59)
GLUCOSE: 98 mg/dL (ref 65–99)
Globulin, Total: 2.5 g/dL (ref 1.5–4.5)
Potassium: 4.7 mmol/L (ref 3.5–5.2)
Sodium: 141 mmol/L (ref 134–144)
Total Protein: 6.8 g/dL (ref 6.0–8.5)

## 2015-05-29 LAB — TSH: TSH: 1.64 u[IU]/mL (ref 0.450–4.500)

## 2015-05-29 LAB — VITAMIN D 25 HYDROXY (VIT D DEFICIENCY, FRACTURES): VIT D 25 HYDROXY: 15.5 ng/mL — AB (ref 30.0–100.0)

## 2015-05-29 LAB — CBC
Hematocrit: 36.5 % (ref 34.0–46.6)
Hemoglobin: 12.5 g/dL (ref 11.1–15.9)
MCH: 30.2 pg (ref 26.6–33.0)
MCHC: 34.2 g/dL (ref 31.5–35.7)
MCV: 88 fL (ref 79–97)
PLATELETS: 337 10*3/uL (ref 150–379)
RBC: 4.14 x10E6/uL (ref 3.77–5.28)
RDW: 13.1 % (ref 12.3–15.4)
WBC: 8 10*3/uL (ref 3.4–10.8)

## 2015-05-29 LAB — LIPID PANEL
CHOLESTEROL TOTAL: 170 mg/dL (ref 100–199)
Chol/HDL Ratio: 5.2 ratio units — ABNORMAL HIGH (ref 0.0–4.4)
HDL: 33 mg/dL — AB (ref >39)
LDL Calculated: 113 mg/dL — ABNORMAL HIGH (ref 0–99)
TRIGLYCERIDES: 122 mg/dL (ref 0–149)
VLDL Cholesterol Cal: 24 mg/dL (ref 5–40)

## 2015-05-29 LAB — HEMOGLOBIN A1C
Est. average glucose Bld gHb Est-mCnc: 111 mg/dL
HEMOGLOBIN A1C: 5.5 % (ref 4.8–5.6)

## 2015-06-02 ENCOUNTER — Telehealth: Payer: Self-pay | Admitting: Adult Health

## 2015-06-02 ENCOUNTER — Encounter: Payer: Self-pay | Admitting: Adult Health

## 2015-06-02 DIAGNOSIS — E559 Vitamin D deficiency, unspecified: Secondary | ICD-10-CM

## 2015-06-02 HISTORY — DX: Vitamin D deficiency, unspecified: E55.9

## 2015-06-02 LAB — CYTOLOGY - PAP

## 2015-06-02 MED ORDER — CHOLECALCIFEROL 125 MCG (5000 UT) PO CAPS: 5000.0000 [IU] | ORAL_CAPSULE | Freq: Every day | ORAL | Status: DC

## 2015-06-02 NOTE — Telephone Encounter (Signed)
Left message about labs, needs to take vitamin D3 5000 IU per day and increase exercise and decrease carbs and fats

## 2015-06-17 ENCOUNTER — Ambulatory Visit (HOSPITAL_COMMUNITY)
Admission: RE | Admit: 2015-06-17 | Discharge: 2015-06-17 | Disposition: A | Discharge status: Home / Self Care | Payer: Medicaid Other | Source: Ambulatory Visit | Attending: Internal Medicine | Admitting: Internal Medicine

## 2015-06-17 ENCOUNTER — Other Ambulatory Visit (HOSPITAL_COMMUNITY): Payer: Self-pay | Admitting: Internal Medicine

## 2015-06-17 DIAGNOSIS — Z111 Encounter for screening for respiratory tuberculosis: Secondary | ICD-10-CM | POA: Diagnosis not present

## 2016-01-27 ENCOUNTER — Ambulatory Visit (INDEPENDENT_AMBULATORY_CARE_PROVIDER_SITE_OTHER): Payer: BC Managed Care – PPO | Admitting: Orthopaedic Surgery

## 2016-01-27 VITALS — Ht 67.0 in | Wt 191.0 lb

## 2016-01-27 DIAGNOSIS — G8929 Other chronic pain: Secondary | ICD-10-CM

## 2016-01-27 DIAGNOSIS — M25562 Pain in left knee: Secondary | ICD-10-CM | POA: Diagnosis not present

## 2016-01-27 DIAGNOSIS — M25561 Pain in right knee: Secondary | ICD-10-CM

## 2016-01-27 NOTE — Progress Notes (Signed)
   Office Visit Note   Patient: Katherine [name]           Date of Birth: [birth year]           MRN: [id number] Visit Date: 01/27/2016              Requested by: Butch PennyAngus McInnis, MD [address] [place], [place] [id number] PCP: Pearson GrippeJames Kim, MD   Assessment & Plan: Visit Diagnoses: No diagnosis found.  Plan: Doing well.  Follow-up as needed at this point.  Steroid injections in her knees next if needed in the winter.  Did well with the Monvisc  Follow-Up Instructions: No Follow-up on file.   Orders:  No orders of the defined types were placed in this encounter.  No orders of the defined types were placed in this encounter.     Procedures: No procedures performed   Clinical Data: No additional findings.   Subjective: Chief Complaint  Patient presents with  . Right Knee - Pain  . Left Knee - Pain    Pt is s/p Monovisc injections bilat knees Right knee in June and left knee in August. Post injection pt states that she is feeling pretty with the exception of some start up stiffness after prolonged rest. The pt states that it is a " pinch" feeling that she has with increased activity. Rest helps with this does not need rx for this. Happy about the resolved sewelling and overall feeling better since injection    Review of Systems   Objective: Vital Signs: There were no vitals taken for this visit.  Physical Exam  Ortho Exam No knee effusions. Great ROM Stable   Specialty Comments:  No specialty comments available.  Imaging: No results found.   PMFS History: Patient Active Problem List   Diagnosis Date Noted  . Vitamin D deficiency 06/02/2015  . Low back pain without sciatica 05/28/2015   Past Medical History:  Diagnosis Date  . Bronchitis   . Low back pain without sciatica 05/28/2015  . Migraines   . Vitamin D deficiency 06/02/2015    Family History  Problem Relation Age of Onset  . Dementia Mother   . Cancer Father     colon, kidney  . Hypertension  Sister   . Other Son     frequent ear infections; optical nerve and optical cup enlarged  . Dementia Maternal Grandmother   . Alcohol abuse Maternal Grandfather   . Leukemia Paternal Grandmother   . Cancer Paternal Grandfather     lung  . Alcohol abuse Paternal Grandfather     Past Surgical History:  Procedure Laterality Date  . ENDOMETRIAL ABLATION    . SHOULDER SURGERY    . TUBAL LIGATION     Social History   Occupational History  . Not on file.   Social History Main Topics  . Smoking status: Never Smoker  . Smokeless tobacco: Never Used  . Alcohol use No  . Drug use: No  . Sexual activity: Not Currently    Birth control/ protection: Surgical     Comment: tubal

## 2016-02-09 ENCOUNTER — Other Ambulatory Visit (HOSPITAL_COMMUNITY): Payer: Self-pay | Admitting: Internal Medicine

## 2016-02-09 DIAGNOSIS — Z1231 Encounter for screening mammogram for malignant neoplasm of breast: Secondary | ICD-10-CM

## 2016-03-04 ENCOUNTER — Ambulatory Visit (HOSPITAL_COMMUNITY): Payer: Medicaid Other

## 2016-03-04 ENCOUNTER — Ambulatory Visit (HOSPITAL_COMMUNITY)
Admission: RE | Admit: 2016-03-04 | Discharge: 2016-03-04 | Disposition: A | Discharge status: Home / Self Care | Payer: BC Managed Care – PPO | Source: Ambulatory Visit | Attending: Internal Medicine | Admitting: Internal Medicine

## 2016-03-04 ENCOUNTER — Other Ambulatory Visit (HOSPITAL_COMMUNITY): Payer: Self-pay | Admitting: Internal Medicine

## 2016-03-04 DIAGNOSIS — Z1231 Encounter for screening mammogram for malignant neoplasm of breast: Secondary | ICD-10-CM | POA: Diagnosis present

## 2016-03-10 ENCOUNTER — Emergency Department (HOSPITAL_COMMUNITY)
Admission: EM | Admit: 2016-03-10 | Discharge: 2016-03-10 | Disposition: A | Discharge status: Home / Self Care | Payer: BC Managed Care – PPO | Attending: Emergency Medicine | Admitting: Emergency Medicine

## 2016-03-10 ENCOUNTER — Encounter (HOSPITAL_COMMUNITY): Payer: Self-pay | Admitting: Emergency Medicine

## 2016-03-10 DIAGNOSIS — R5383 Other fatigue: Secondary | ICD-10-CM | POA: Insufficient documentation

## 2016-03-10 DIAGNOSIS — M549 Dorsalgia, unspecified: Secondary | ICD-10-CM | POA: Insufficient documentation

## 2016-03-10 DIAGNOSIS — R0602 Shortness of breath: Secondary | ICD-10-CM | POA: Diagnosis not present

## 2016-03-10 DIAGNOSIS — Z79899 Other long term (current) drug therapy: Secondary | ICD-10-CM | POA: Diagnosis not present

## 2016-03-10 DIAGNOSIS — R350 Frequency of micturition: Secondary | ICD-10-CM | POA: Diagnosis not present

## 2016-03-10 DIAGNOSIS — R200 Anesthesia of skin: Secondary | ICD-10-CM | POA: Insufficient documentation

## 2016-03-10 DIAGNOSIS — R531 Weakness: Secondary | ICD-10-CM | POA: Diagnosis not present

## 2016-03-10 DIAGNOSIS — R112 Nausea with vomiting, unspecified: Secondary | ICD-10-CM | POA: Insufficient documentation

## 2016-03-10 LAB — CBG MONITORING, ED: Glucose-Capillary: 90 mg/dL (ref 65–99)

## 2016-03-10 LAB — CBC WITH DIFFERENTIAL/PLATELET
BASOS PCT: 0 %
Basophils Absolute: 0 10*3/uL (ref 0.0–0.1)
EOS ABS: 0 10*3/uL (ref 0.0–0.7)
Eosinophils Relative: 0 %
HCT: 37.6 % (ref 36.0–46.0)
Hemoglobin: 12.7 g/dL (ref 12.0–15.0)
Lymphocytes Relative: 12 %
Lymphs Abs: 1 10*3/uL (ref 0.7–4.0)
MCH: 29.7 pg (ref 26.0–34.0)
MCHC: 33.8 g/dL (ref 30.0–36.0)
MCV: 87.9 fL (ref 78.0–100.0)
MONO ABS: 0.5 10*3/uL (ref 0.1–1.0)
Monocytes Relative: 6 %
Neutro Abs: 6.9 10*3/uL (ref 1.7–7.7)
Neutrophils Relative %: 82 %
Platelets: 239 10*3/uL (ref 150–400)
RBC: 4.28 MIL/uL (ref 3.87–5.11)
RDW: 12.9 % (ref 11.5–15.5)
WBC: 8.3 10*3/uL (ref 4.0–10.5)

## 2016-03-10 LAB — URINALYSIS, ROUTINE W REFLEX MICROSCOPIC
BILIRUBIN URINE: NEGATIVE
Glucose, UA: NEGATIVE mg/dL
HGB URINE DIPSTICK: NEGATIVE
KETONES UR: NEGATIVE mg/dL
Leukocytes, UA: NEGATIVE
NITRITE: NEGATIVE
PH: 8 (ref 5.0–8.0)
Protein, ur: NEGATIVE mg/dL
Specific Gravity, Urine: 1.005 (ref 1.005–1.030)

## 2016-03-10 LAB — BASIC METABOLIC PANEL
Anion gap: 6 (ref 5–15)
BUN: 13 mg/dL (ref 6–20)
CALCIUM: 9.2 mg/dL (ref 8.9–10.3)
CO2: 24 mmol/L (ref 22–32)
CREATININE: 0.99 mg/dL (ref 0.44–1.00)
Chloride: 106 mmol/L (ref 101–111)
GFR calc non Af Amer: >60 mL/min (ref >60)
Glucose, Bld: 106 mg/dL — ABNORMAL HIGH (ref 65–99)
Potassium: 3.4 mmol/L — ABNORMAL LOW (ref 3.5–5.1)
SODIUM: 136 mmol/L (ref 135–145)

## 2016-03-10 MED ORDER — ONDANSETRON HCL 4 MG/2ML IJ SOLN
4.0000 mg | Freq: Once | INTRAMUSCULAR | Status: AC
  Administered 2016-03-10: 4 mg via INTRAVENOUS
  Filled 2016-03-10: qty 2

## 2016-03-10 MED ORDER — ONDANSETRON HCL 4 MG PO TABS: 4.0000 mg | ORAL_TABLET | Freq: Three times a day (TID) | ORAL | 0 refills | Status: DC | PRN

## 2016-03-10 MED ORDER — SODIUM CHLORIDE 0.9 % IV BOLUS (SEPSIS)
1000.0000 mL | Freq: Once | INTRAVENOUS | Status: AC
  Administered 2016-03-10: 1000 mL via INTRAVENOUS

## 2016-03-10 NOTE — ED Triage Notes (Signed)
Having fatigue ofr months.  Feeling tired, weak, SOB at times and occasional pain to pain in shoulder blades and have numbness and tingling to both haves after falling asleep.

## 2016-03-10 NOTE — ED Provider Notes (Signed)
[facility]-EMERGENCY DEPT Provider Note   CSN: [id number] Arrival date & time: 03/10/16  [id number]  By signing my name below, I, Katherine [name], attest that this documentation has been prepared under the direction and in the presence of Katherine MemosJason Makana Feigel, MD. Electronically Signed: Placido SouLogan [name], ED Scribe. 03/10/16. 9:35 AM.   History   Chief Complaint No chief complaint on file.  HPI HPI Comments: Katherine [name] is a 45 y.o. female who presents to the Emergency Department complaining of persistent vomiting onset last night. Pt states she began experiencing her vomiting with associated finger numbness, upper back cramping, weakness, fatigue, increased urinary frequency, dry mouth, and SOB. Her last episode of vomiting was ~4 hours ago. She states that the numbness in her fingers, fatigue and weakness has been an ongoing issue. Pt states she takes propanolol, lovastatin and yesterday was started on baby aspirin by her PCP. She additionally reports chronic knee pain which she denies has acutely worsened. 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 She denies any other family members who have been sick recently. She is having regular BMs. Pt reports a FMHx of DM. She denies abdominal pain, diarrhea, fever, chills, dysuria, rash, difficulty urinating or other associated symptoms at this time.   The history is provided by the patient and medical records. No language interpreter was used.    Past Medical History:  Diagnosis Date  . Bronchitis   . Low back pain without sciatica 05/28/2015  . Migraines   . Vitamin D deficiency 06/02/2015    Patient Active Problem List   Diagnosis Date Noted  . Vitamin D deficiency 06/02/2015  . Low back pain without sciatica 05/28/2015    Past Surgical History:  Procedure Laterality Date  . ENDOMETRIAL ABLATION    . SHOULDER SURGERY    . TUBAL LIGATION      OB History    Gravida Para Term Preterm AB Living   2 1     1 1    SAB TAB Ectopic  Multiple Live Births     1             Home Medications    Prior to Admission medications   Medication Sig Start Date End Date Taking? Authorizing Provider  Cholecalciferol 5000 units capsule Take 1 capsule (5,000 Units total) by mouth daily. 06/02/15   Adline PotterJennifer A Griffin, NP  cyclobenzaprine (FLEXERIL) 5 MG tablet Take 1 tablet (5 mg total) by mouth 3 (three) times daily as needed for muscle spasms. 05/28/15   Adline PotterJennifer A Griffin, NP  ondansetron (ZOFRAN) 4 MG tablet Take 1 tablet (4 mg total) by mouth every 8 (eight) hours as needed for nausea or vomiting. 03/10/16   Katherine MemosJason Morris Markham, MD  propranolol (INDERAL) 80 MG tablet Take 80 mg by mouth 2 (two) times daily. 01/09/16   Historical Provider, MD  rizatriptan (MAXALT) 10 MG tablet 10 mg as needed.  03/21/15   Historical Provider, MD    Family History Family History  Problem Relation Age of Onset  . Dementia Mother   . Cancer Father     colon, kidney  . Hypertension Sister   . Other Son     frequent ear infections; optical nerve and optical cup enlarged  . Dementia Maternal Grandmother   . Alcohol abuse Maternal Grandfather   . Leukemia Paternal Grandmother   . Cancer Paternal Grandfather     lung  . Alcohol abuse Paternal Grandfather     Social History Social History  Substance Use Topics  . Smoking status: Never Smoker  . Smokeless tobacco: Never Used  . Alcohol use No     Allergies   Patient has no known allergies.   Review of Systems Review of Systems  Constitutional: Positive for appetite change and fatigue. Negative for chills and fever.  Respiratory: Positive for shortness of breath.   Gastrointestinal: Positive for nausea and vomiting. Negative for abdominal pain, constipation and diarrhea.  Genitourinary: Positive for frequency. Negative for difficulty urinating and dysuria.  Musculoskeletal: Positive for back pain and myalgias.  Skin: Negative for rash.  Neurological: Positive for weakness and numbness.  All  other systems reviewed and are negative.  Physical Exam Updated Vital Signs BP 125/76   Pulse 83   Temp 98.1 F (36.7 C) (Oral)   Resp 16   Ht 5\' 7"  (1.702 m)   Wt 193 lb (87.5 kg)   SpO2 100%   BMI 30.23 kg/m   Physical Exam  Constitutional: She is oriented to person, place, and time. She appears well-developed and well-nourished. No distress.  HENT:  Head: Normocephalic and atraumatic.  Eyes: EOM are normal.  Neck: Normal range of motion.  Cardiovascular: Normal rate, regular rhythm and normal heart sounds.   Pulmonary/Chest: Effort normal and breath sounds normal.  Abdominal: Soft. She exhibits no distension. There is no tenderness.  Musculoskeletal: Normal range of motion.  Neurological: She is alert and oriented to person, place, and time.  Skin: Skin is warm and dry.  Psychiatric: She has a normal mood and affect. Judgment normal.  Nursing note and vitals reviewed.  ED Treatments / Results  Labs (all labs ordered are listed, but only abnormal results are displayed) Labs Reviewed  BASIC METABOLIC PANEL - Abnormal; Notable for the following:       Result Value   Potassium 3.4 (*)    Glucose, Bld 106 (*)    All other components within normal limits  URINALYSIS, ROUTINE W REFLEX MICROSCOPIC - Abnormal; Notable for the following:    Color, Urine STRAW (*)    All other components within normal limits  CBC WITH DIFFERENTIAL/PLATELET  CBG MONITORING, ED    EKG  EKG Interpretation  Date/Time:  Wednesday March 10 2016 10:05:26 EST Ventricular Rate:  77 PR Interval:    QRS Duration: 82 QT Interval:  409 QTC Calculation: 463 R Axis:   74 Text Interpretation:  Sinus rhythm Consider left atrial enlargement Low voltage, precordial leads No old tracing to compare Confirmed by [facility]ESNER MD, Barbara CowerJASON [phone] on 03/10/2016 10:32:40 AM       Radiology No results found.  Procedures Procedures  DIAGNOSTIC STUDIES: Oxygen Saturation is 100% on RA, normal by my  interpretation.    COORDINATION OF CARE: 9:34 AM Discussed next steps with pt. Pt verbalized understanding and is agreeable with the plan.    Medications Ordered in ED Medications  sodium chloride 0.9 % bolus 1,000 mL (1,000 mLs Intravenous New Bag/Given 03/10/16 0957)  ondansetron (ZOFRAN) injection 4 mg (4 mg Intravenous Given 03/10/16 1003)     Initial Impression / Assessment and Plan / ED Course  I have reviewed the triage vital signs and the nursing notes.  Pertinent labs & imaging results that were available during my care of the patient were reviewed by me and considered in my medical decision making (see chart for details).  Clinical Course     Negative workup. Considered ACS, diabetes, UTI as causes for her symptoms however workup for these were negative. No  neurologic findings at this time suggest any central causes for her symptoms. I think her hand numbness is being worked up by her doctors likely related to paresthesias of some sort. Chronic fatigue is also being worked up by primary doctor and multiple tests have been done I will defer to her primary doctor for that.  Emesis improved in ED, likely GI in origin (possibly viral) so will dc on zofran.   I personally performed the services described in this documentation, which was scribed in my presence. The recorded information has been reviewed and is accurate.   Final Clinical Impressions(s) / ED Diagnoses   Final diagnoses:  Non-intractable vomiting with nausea, unspecified vomiting type    New Prescriptions New Prescriptions   ONDANSETRON (ZOFRAN) 4 MG TABLET    Take 1 tablet (4 mg total) by mouth every 8 (eight) hours as needed for nausea or vomiting.     Katherine Memos, MD 03/10/16 1115

## 2016-11-24 ENCOUNTER — Encounter: Payer: Self-pay | Admitting: Adult Health

## 2016-11-24 ENCOUNTER — Ambulatory Visit (INDEPENDENT_AMBULATORY_CARE_PROVIDER_SITE_OTHER): Payer: BC Managed Care – PPO | Admitting: Adult Health

## 2016-11-24 ENCOUNTER — Encounter (INDEPENDENT_AMBULATORY_CARE_PROVIDER_SITE_OTHER): Payer: Self-pay

## 2016-11-24 VITALS — BP 104/62 | HR 66 | Ht 66.0 in | Wt 202.0 lb

## 2016-11-24 DIAGNOSIS — Z1211 Encounter for screening for malignant neoplasm of colon: Secondary | ICD-10-CM

## 2016-11-24 DIAGNOSIS — Z01419 Encounter for gynecological examination (general) (routine) without abnormal findings: Secondary | ICD-10-CM

## 2016-11-24 DIAGNOSIS — Z1212 Encounter for screening for malignant neoplasm of rectum: Secondary | ICD-10-CM | POA: Diagnosis not present

## 2016-11-24 LAB — HEMOCCULT GUIAC POC 1CARD (OFFICE): FECAL OCCULT BLD: NEGATIVE

## 2016-11-24 NOTE — Patient Instructions (Signed)
Physical in 1 year Pap in 2020 Mammogram yearly Labs with PCP Colonoscopy at 49

## 2016-11-24 NOTE — Progress Notes (Signed)
Patient ID: Katherine [name], female   DOB: 08-30-1970, 46 y.o.   MRN: [id number] History of Present Illness: Ameia is a 46 year old black female in for well woman gyn exam,she had normal pap with negative HPV 05/2215. PCP is Dr Selena Batten.  Current Medications, Allergies, Past Medical History, Past Surgical History, Family History and Social History were reviewed in [employer] record.     Review of Systems:  Patient denies any headaches, hearing loss, fatigue, blurred vision, shortness of breath, chest pain, abdominal pain, problems with bowel movements, urination, or intercourse(not having sex). No joint pain or mood swings.Has pain in left breast at times, and left groin.   Physical Exam:BP 104/62 (BP Location: Left Arm, Patient Position: Sitting, Cuff Size: Large)   Pulse 66   Ht 5\' 6"  (1.676 m)   Wt 202 lb (91.6 kg)   BMI 32.60 kg/m  General:  Well developed, well nourished, no acute distress Skin:  Warm and dry Neck:  Midline trachea, normal thyroid, good ROM, no lymphadenopathy Lungs; Clear to auscultation bilaterally Breast:  No dominant palpable mass, retraction, or nipple discharge Cardiovascular: Regular rate and rhythm Abdomen:  Soft, non tender, no hepatosplenomegaly Pelvic:  External genitalia is normal in appearance, no lesions.  The vagina is normal in appearance. Urethra has no lesions or masses. The cervix is bulbous.  Uterus is felt to be normal size, shape, and contour.  No adnexal masses or tenderness noted.Bladder is non tender, no masses felt. Rectal: Good sphincter tone, no polyps, or hemorrhoids felt.  Hemoccult negative. Extremities/musculoskeletal:  No swelling or varicosities noted, no clubbing or cyanosis Psych:  No mood changes, alert and cooperative,seems happy PHQ 2 score 0. Discussed could be lymph node in groin, but as long as goes away, no problem.  Impression: 1. Well woman exam with routine gynecological exam   2. Screening for  colorectal cancer       Plan: Physical in 1 year Pap in 2020 Mammogram yearly Labs with PCP Colonoscopy at 49

## 2017-02-08 ENCOUNTER — Other Ambulatory Visit (HOSPITAL_COMMUNITY): Payer: Self-pay | Admitting: Family Medicine

## 2017-02-08 ENCOUNTER — Ambulatory Visit (HOSPITAL_COMMUNITY)
Admission: RE | Admit: 2017-02-08 | Discharge: 2017-02-08 | Disposition: A | Discharge status: Home / Self Care | Payer: BC Managed Care – PPO | Source: Ambulatory Visit | Attending: Family Medicine | Admitting: Family Medicine

## 2017-02-08 DIAGNOSIS — R202 Paresthesia of skin: Secondary | ICD-10-CM | POA: Insufficient documentation

## 2017-02-08 DIAGNOSIS — M542 Cervicalgia: Secondary | ICD-10-CM

## 2017-04-13 ENCOUNTER — Ambulatory Visit (HOSPITAL_COMMUNITY): Payer: BC Managed Care – PPO | Attending: Internal Medicine

## 2017-04-13 DIAGNOSIS — R293 Abnormal posture: Secondary | ICD-10-CM

## 2017-04-13 DIAGNOSIS — M5412 Radiculopathy, cervical region: Secondary | ICD-10-CM | POA: Diagnosis present

## 2017-04-13 DIAGNOSIS — M542 Cervicalgia: Secondary | ICD-10-CM | POA: Insufficient documentation
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
## 2017-04-14 ENCOUNTER — Ambulatory Visit (HOSPITAL_COMMUNITY): Payer: BC Managed Care – PPO

## 2017-04-14 DIAGNOSIS — R293 Abnormal posture: Secondary | ICD-10-CM

## 2017-04-14 DIAGNOSIS — M542 Cervicalgia: Secondary | ICD-10-CM

## 2017-04-14 DIAGNOSIS — M5412 Radiculopathy, cervical region: Secondary | ICD-10-CM

## 2017-04-14 NOTE — Therapy (Signed)
[facility] [address] [place], [place], [address] Phone: [phone]   Fax:  [phone]  Physical Therapy Treatment  Patient Details  Name: Katherine [name] MRN: [id number] Date of Birth: December 29, 1970 Referring Provider: Pearson Grippe    Encounter Date: 04/14/2017  PT End of Session - 04/14/17 1026    Visit Number  2    Number of Visits  16    Date for PT Re-Evaluation  05/14/17    Authorization Type  [employer]     Authorization Time Period  04/13/17-06/11/17    PT Start Time  0946    PT Stop Time  1030    PT Time Calculation (min)  44 min    Activity Tolerance  Patient tolerated treatment well;No increased pain    Behavior During Therapy  WFL for tasks assessed/performed       Past Medical History:  Diagnosis Date  . Bronchitis   . Low back pain without sciatica 05/28/2015  . Migraines   . Vitamin D deficiency 06/02/2015    Past Surgical History:  Procedure Laterality Date  . ENDOMETRIAL ABLATION    . SHOULDER SURGERY    . TUBAL LIGATION      There were no vitals filed for this visit.  Subjective Assessment - 04/14/17 0951    Subjective  Pt reports she is doign well since eval yesterday, no updates since then.     Currently in Pain?  No/denies soreness only                      [facility] Adult PT Treatment/Exercise - 04/14/17 0001      Neck Exercises: Supine   Neck Retraction  10 reps;3 secs chin tuck + retraction, 3 pillows: HEP    Other Supine Exercise  supine longitudinal thoracic towel roll pec minor stretch: 1x15 minutes scapular protraciton on towel roll      Manual Therapy   Manual Therapy  Joint mobilization;Passive ROM;Myofascial release    Joint Mobilization  capuital flexion mob 2x30sec, C1 lateral glide 1x30sec bilat,  all grade IV x    Myofascial Release  suboccipitals x 6 minutes    Passive ROM  upper trap stretch supine: 3x30sec bilat             PT Education - 04/13/17 1235     Education provided  Yes    Education Details  HEP and postural education    Person(s) Educated  Patient    Methods  Explanation;Demonstration;Handout;Verbal cues;Tactile cues    Comprehension  Need further instruction       PT Short Term Goals - 04/13/17 1250      PT SHORT TERM GOAL #1   Title  After 4 weeks patient will demonstrate improved cervical rotation ROM to 75 degree or greater.     Time  4    Period  Weeks    Status  New    Target Date  05/11/17      PT SHORT TERM GOAL #2   Title  After 4 week spatient will demonstrate improved chin to sternum ROM to 1 finger.     Time  4    Period  Weeks    Status  New    Target Date  05/11/17        PT Long Term Goals - 04/13/17 1300      PT LONG TERM GOAL #1   Title  After 8 weeks patient will  demonstrate improved deep neck flexor endurance isomatric hold of 15 seconds while maintaining chinc tuck.     Time  8    Period  Weeks    Status  New    Target Date  06/11/17      PT LONG TERM GOAL #2   Title  After 8 weeks patient will demonstrate improved cervical ROM rotatiton>77 degrees bilat, flexion>50 degrees, and sidebending >35 degrees bilat.     Time  8    Period  Weeks    Status  New    Target Date  06/11/17      PT LONG TERM GOAL #3   Title  After 8 weeks patient will report improved tolerance to sitting while driving>60 minutes without aggravation of pain or symptoms.     Time  8    Period  Weeks    Status  New    Target Date  06/11/17      PT LONG TERM GOAL #4   Title  After 8 weeks patient will report decreased incidence of BUE paresthesia <1x/week.     Time  8    Period  Weeks    Status  New    Target Date  06/11/17      PT LONG TERM GOAL #5   Title  After 8 weeks patient will report return to AMB 2-133miles daily 3-5x weekly without exacerbation of pain or symptoms.     Time  8    Period  Weeks    Status  New    Target Date  06/11/17            Plan - 04/14/17 1028    Clinical Impression  Statement  Reviewed goals adn examination, aswell as HEP. Began manual therpay to address capital hypomobility. Taught 1 more capital flexion stretch, and mobilization. Pt completed session pain free. Improved resolve of Right C2 parestheisa with Left lateral glide in supiine. Good progress toward goals.     Rehab Potential  Good    PT Frequency  2x / week    PT Duration  8 weeks    PT Treatment/Interventions  ADLs/Self Care Home Management;Biofeedback;Cryotherapy;Electrical Stimulation;Moist Heat;Passive range of motion;Patient/family education;Gait training;Functional mobility training;Therapeutic activities;Therapeutic exercise;Dry needling    PT Next Visit Plan  continue with suboccipital mobility capital flexion, suboccipital release, capital mobilization and/or stretching as tolerated.     PT Home Exercise Plan  Supine thoacic towel roll pec minor stretch, chintuck/neck retraction into 3 pillows    Consulted and Agree with Plan of Care  Patient       Patient will benefit from skilled therapeutic intervention in order to improve the following deficits and impairments:  Hypomobility, Decreased endurance, Decreased knowledge of precautions, Obesity, Pain, Decreased strength, Decreased activity tolerance, Decreased range of motion, Improper body mechanics, Postural dysfunction  Visit Diagnosis: Cervicalgia  Radiculopathy, cervical region  Abnormal posture     Problem List Patient Active Problem List   Diagnosis Date Noted  . Well woman exam with routine gynecological exam 11/24/2016  . Vitamin D deficiency 06/02/2015  . Low back pain without sciatica 05/28/2015   10:38 AM, 04/14/17 Rosamaria LintsAllan C Emin Foree, PT, DPT Physical Therapist at [facility] [phone] (office)      Rosamaria LintsBuccola,Sender Rueb C 04/14/2017, 10:36 AM  [facility] [address] [place], [place], [address] Phone: [phone]   Fax:   [phone]  Name: Katherine [name] MRN: [id number] Date of Birth: January 06, 1971

## 2017-04-19 ENCOUNTER — Ambulatory Visit (HOSPITAL_COMMUNITY): Payer: BC Managed Care – PPO

## 2017-04-19 DIAGNOSIS — M542 Cervicalgia: Secondary | ICD-10-CM | POA: Diagnosis not present

## 2017-04-19 DIAGNOSIS — M5412 Radiculopathy, cervical region: Secondary | ICD-10-CM

## 2017-04-19 DIAGNOSIS — R293 Abnormal posture: Secondary | ICD-10-CM

## 2017-04-19 NOTE — Patient Instructions (Signed)

## 2017-04-19 NOTE — Therapy (Signed)
[facility] [address] [place], [place], [address] Phone: [phone]   Fax:  [phone]  Physical Therapy Treatment  Patient Details  Name: Katherine [name] MRN: [id number] Date of Birth: Aug 14, 1970 Referring Provider: Pearson GrippeJames Kim    Encounter Date: 04/19/2017  PT End of Session - 04/19/17 1240    Visit Number  3    Number of Visits  16    Date for PT Re-Evaluation  05/14/17    Authorization Type  [employer]     Authorization Time Period  04/13/17-06/11/17    PT Start Time  1127    PT Stop Time  1208    PT Time Calculation (min)  41 min    Activity Tolerance  Patient tolerated treatment well;No increased pain    Behavior During Therapy  WFL for tasks assessed/performed       Past Medical History:  Diagnosis Date  . Bronchitis   . Low back pain without sciatica 05/28/2015  . Migraines   . Vitamin D deficiency 06/02/2015    Past Surgical History:  Procedure Laterality Date  . ENDOMETRIAL ABLATION    . SHOULDER SURGERY    . TUBAL LIGATION      There were no vitals filed for this visit.  Subjective Assessment - 04/19/17 1129    Subjective  Pt says she is doing well today, no pain, but can attet to continued tightness. She reports new HEP additions were performed without issue.     Limitations  Sitting    Currently in Pain?  No/denies           [facility] PT Treatment/Exercise - 04/19/17 0001      Neck Exercises: Standing   Other Standing Exercises  capital flexion repeated mvtmt mobilization:  1x25, leaning on door, folded towel hehind head    Other Standing Exercises  rotation in retraction, leaning on door, towel being head: 1x15x bilat      Neck Exercises: Seated   Other Seated Exercise  Robbery: 10x3secH Leaning against wall, head retracted into towel     Other Seated Exercise  self mobilization captial rotation, flexion. 15x bilat each.       Neck Exercises: Supine   Neck Retraction  -- chin tuck +  retraction, 3 pillows: HEP      Manual Therapy   Myofascial Release  suboccipitals x 7 minutes    Passive ROM  Ear to axilla stretch 2x30s bilat; cap rot 2x30sec       Trigger Point Dry Needling - 04/19/17 1139    Consent Given?  Yes    Education Handout Provided  Yes    Muscles Treated Upper Body  Suboccipitals muscle group    SubOccipitals Response  Twitch response elicited;Palpable increased muscle length           PT Education - 04/19/17 1239    Education provided  Yes    Education Details  role to dry needling in treatment course    Person(s) Educated  Patient    Methods  Explanation;Demonstration;Handout    Comprehension  Need further instruction       PT Short Term Goals - 04/13/17 1250      PT SHORT TERM GOAL #1   Title  After 4 weeks patient will demonstrate improved cervical rotation ROM to 75 degree or greater.     Time  4    Period  Weeks    Status  New    Target Date  05/11/17      PT SHORT TERM GOAL #2   Title  After 4 week spatient will demonstrate improved chin to sternum ROM to 1 finger.     Time  4    Period  Weeks    Status  New    Target Date  05/11/17        PT Long Term Goals - 04/13/17 1300      PT LONG TERM GOAL #1   Title  After 8 weeks patient will demonstrate improved deep neck flexor endurance isomatric hold of 15 seconds while maintaining chinc tuck.     Time  8    Period  Weeks    Status  New    Target Date  06/11/17      PT LONG TERM GOAL #2   Title  After 8 weeks patient will demonstrate improved cervical ROM rotatiton>77 degrees bilat, flexion>50 degrees, and sidebending >35 degrees bilat.     Time  8    Period  Weeks    Status  New    Target Date  06/11/17      PT LONG TERM GOAL #3   Title  After 8 weeks patient will report improved tolerance to sitting while driving>60 minutes without aggravation of pain or symptoms.     Time  8    Period  Weeks    Status  New    Target Date  06/11/17      PT LONG TERM GOAL #4    Title  After 8 weeks patient will report decreased incidence of BUE paresthesia <1x/week.     Time  8    Period  Weeks    Status  New    Target Date  06/11/17      PT LONG TERM GOAL #5   Title  After 8 weeks patient will report return to AMB 2-71miles daily 3-5x weekly without exacerbation of pain or symptoms.     Time  8    Period  Weeks    Status  New    Target Date  06/11/17            Plan - 04/19/17 1242    Clinical Impression Statement  Continued with postural education, performing many exercises leaning on door with head retracted into a folded towel for biofeedback on neutral posture. Pt agreeabel to atrial fo dry needlign today which is directed to the rectus capitus major, rectu scapitus minor, and obliquus capitus superior. Tghis is followed by manual release of the tissue, P/ROM stretching in supine, adn then education on and performance of self mobilziation. Pt appears to demonstrate imrpoved rotational ROM this session, but no formal meausrement is taken. Capital tightness remains somewhat greater on the right side than the left. Pt making good progress toward goals so far.     Rehab Potential  Good    PT Frequency  2x / week    PT Duration  8 weeks    PT Treatment/Interventions  ADLs/Self Care Home Management;Biofeedback;Cryotherapy;Electrical Stimulation;Moist Heat;Passive range of motion;Patient/family education;Gait training;Functional mobility training;Therapeutic activities;Therapeutic exercise;Dry needling    PT Next Visit Plan  continue with suboccipital mobility capital flexion, suboccipital release, capital mobilization and/or stretching as tolerated; Begin Deep neck flexor endurance training.     PT Home Exercise Plan  Supine thoacic towel roll pec minor stretch, chintuck/neck retraction into 3 pillows; seated capital rotation mobilization, seate dcapital flexion nodding mobilization.        Patient will benefit from skilled  therapeutic intervention in order  to improve the following deficits and impairments:  Hypomobility, Decreased endurance, Decreased knowledge of precautions, Obesity, Pain, Decreased strength, Decreased activity tolerance, Decreased range of motion, Improper body mechanics, Postural dysfunction  Visit Diagnosis: Cervicalgia  Radiculopathy, cervical region  Abnormal posture     Problem List Patient Active Problem List   Diagnosis Date Noted  . Well woman exam with routine gynecological exam 11/24/2016  . Vitamin D deficiency 06/02/2015  . Low back pain without sciatica 05/28/2015   12:48 PM, 04/19/17 [name], PT, DPT Physical Therapist at [facility] [phone] (office)      [name] 04/19/2017, 12:48 PM  [facility] [address] [place], [place], [address] Phone: [phone]   Fax:  [phone]  Name: Katherine [name] MRN: [id number] Date of Birth: 02-05-1971

## 2017-04-20 ENCOUNTER — Encounter (HOSPITAL_COMMUNITY): Payer: Self-pay | Admitting: Physical Therapy

## 2017-04-20 ENCOUNTER — Ambulatory Visit (HOSPITAL_COMMUNITY): Payer: BC Managed Care – PPO | Admitting: Physical Therapy

## 2017-04-20 DIAGNOSIS — M542 Cervicalgia: Secondary | ICD-10-CM

## 2017-04-20 DIAGNOSIS — M5412 Radiculopathy, cervical region: Secondary | ICD-10-CM

## 2017-04-20 DIAGNOSIS — R293 Abnormal posture: Secondary | ICD-10-CM

## 2017-04-20 NOTE — Therapy (Signed)
[facility] [address] [place], [place], [address] Phone: [phone]   Fax:  [phone]  Physical Therapy Treatment  Patient Details  Name: Katherine [name] MRN: [id number] Date of Birth: 04/15/1970 Referring Provider: Pearson Grippe    Encounter Date: 04/20/2017  PT End of Session - 04/20/17 1127    Visit Number  4    Number of Visits  16    Date for PT Re-Evaluation  05/14/17    Authorization Type  [employer]     Authorization Time Period  04/13/17-06/11/17    Authorization - Visit Number  4    Authorization - Number of Visits  16    PT Start Time  1118    PT Stop Time  1158    PT Time Calculation (min)  40 min    Activity Tolerance  Patient tolerated treatment well;No increased pain    Behavior During Therapy  WFL for tasks assessed/performed       Past Medical History:  Diagnosis Date  . Bronchitis   . Low back pain without sciatica 05/28/2015  . Migraines   . Vitamin D deficiency 06/02/2015    Past Surgical History:  Procedure Laterality Date  . ENDOMETRIAL ABLATION    . SHOULDER SURGERY    . TUBAL LIGATION      There were no vitals filed for this visit.  Subjective Assessment - 04/20/17 1125    Subjective  No pt complaints today; stiff from acupuncture yesterday. Feels she is improving.     Limitations  Sitting    Currently in Pain?  No/denies    Pain Orientation  Posterior    Pain Descriptors / Indicators  Other (Comment) stiff                      [facility] Adult PT Treatment/Exercise - 04/20/17 0001      Neck Exercises: Standing   Other Standing Exercises  capital flexion repeated mvtmt mobilization:  1x25, leaning on door, folded towel hehind head    Other Standing Exercises  rotation in retraction, leaning on door, towel being head: 1x15x bilat      Neck Exercises: Seated   Other Seated Exercise  Robbery: 10x3secH Leaning against wall, head retracted into towel     Other Seated Exercise  self  mobilization captial rotation, flexion. 15x bilat each.       Neck Exercises: Supine   Neck Retraction  -- chin tuck + retraction, 3 pillows: HEP    Capital Flexion  5 reps;3 secs      Manual Therapy   Myofascial Release  suboccipitals x 7 minutes    Passive ROM  Ear to axilla stretch 2x30s bilat; cap rot 2x30sec                 PT Education - 04/20/17 1158    Education provided  Yes    Education Details  seated levator scapula stretch    Person(s) Educated  Patient    Methods  Explanation    Comprehension  Verbalized understanding       PT Short Term Goals - 04/13/17 1250      PT SHORT TERM GOAL #1   Title  After 4 weeks patient will demonstrate improved cervical rotation ROM to 75 degree or greater.     Time  4    Period  Weeks    Status  New    Target Date  05/11/17  PT SHORT TERM GOAL #2   Title  After 4 week spatient will demonstrate improved chin to sternum ROM to 1 finger.     Time  4    Period  Weeks    Status  New    Target Date  05/11/17        PT Long Term Goals - 04/13/17 1300      PT LONG TERM GOAL #1   Title  After 8 weeks patient will demonstrate improved deep neck flexor endurance isomatric hold of 15 seconds while maintaining chinc tuck.     Time  8    Period  Weeks    Status  New    Target Date  06/11/17      PT LONG TERM GOAL #2   Title  After 8 weeks patient will demonstrate improved cervical ROM rotatiton>77 degrees bilat, flexion>50 degrees, and sidebending >35 degrees bilat.     Time  8    Period  Weeks    Status  New    Target Date  06/11/17      PT LONG TERM GOAL #3   Title  After 8 weeks patient will report improved tolerance to sitting while driving>60 minutes without aggravation of pain or symptoms.     Time  8    Period  Weeks    Status  New    Target Date  06/11/17      PT LONG TERM GOAL #4   Title  After 8 weeks patient will report decreased incidence of BUE paresthesia <1x/week.     Time  8    Period  Weeks     Status  New    Target Date  06/11/17      PT LONG TERM GOAL #5   Title  After 8 weeks patient will report return to AMB 2-21miles daily 3-5x weekly without exacerbation of pain or symptoms.     Time  8    Period  Weeks    Status  New    Target Date  06/11/17            Plan - 04/20/17 1134    Clinical Impression Statement  Pt tolerated treatment well without complaints of increased pain. Reported less stiffness and tension in C6-7 and right posterior cervical musculature post treatment.    Clinical Presentation  Stable    Rehab Potential  Good    PT Frequency  2x / week    PT Duration  8 weeks    PT Treatment/Interventions  ADLs/Self Care Home Management;Biofeedback;Cryotherapy;Electrical Stimulation;Moist Heat;Passive range of motion;Patient/family education;Gait training;Functional mobility training;Therapeutic activities;Therapeutic exercise;Dry needling    PT Next Visit Plan  continue with suboccipital mobility capital flexion, suboccipital release, capital mobilization and/or stretching as tolerated; begin decompressive exercises 1-5     PT Home Exercise Plan  Supine thoracic towel roll pec minor stretch, chintuck/neck retraction into 3 pillows; seated capital rotation mobilization, seate dcapital flexion nodding mobilization.  04/20/2017 - seated levator scapula stretch       Patient will benefit from skilled therapeutic intervention in order to improve the following deficits and impairments:  Hypomobility, Decreased endurance, Decreased knowledge of precautions, Obesity, Pain, Decreased strength, Decreased activity tolerance, Decreased range of motion, Improper body mechanics, Postural dysfunction  Visit Diagnosis: Cervicalgia  Radiculopathy, cervical region  Abnormal posture     Problem List Patient Active Problem List   Diagnosis Date Noted  . Well woman exam with routine gynecological exam 11/24/2016  . Vitamin D  deficiency 06/02/2015  . Low back pain without  sciatica 05/28/2015    Virgina OrganCynthia Ginamarie Banfield, PT CLT [phone] 04/20/2017, 12:04 PM  [facility] [address] [place], [place], [address] Phone: [phone]   Fax:  [phone]  Name: Katherine [name] MRN: [id number] Date of Birth: Apr 10, 1970

## 2017-04-20 NOTE — Patient Instructions (Addendum)
Levator Scapula Stretch, Sitting    Sit, one hand tucked under hip on side to be stretched, other hand over top of head. Turn head toward other side and look down. Use hand on head to gently stretch neck in that position. Hold _10__ seconds. Repeat _2__ times per session. Do _2__ sessions per day.  Copyright  [employer]. All rights reserved.

## 2017-04-21 ENCOUNTER — Encounter (HOSPITAL_COMMUNITY): Payer: BC Managed Care – PPO | Admitting: Physical Therapy

## 2017-04-26 ENCOUNTER — Telehealth (HOSPITAL_COMMUNITY): Payer: Self-pay | Admitting: Internal Medicine

## 2017-04-26 ENCOUNTER — Ambulatory Visit (HOSPITAL_COMMUNITY): Payer: BC Managed Care – PPO | Admitting: Physical Therapy

## 2017-04-26 NOTE — Telephone Encounter (Signed)
04/26/17  pt left a message to cx this week - said she wasn't feeling her best and not up to therapy today

## 2017-04-28 ENCOUNTER — Encounter (HOSPITAL_COMMUNITY): Payer: BC Managed Care – PPO | Admitting: Physical Therapy

## 2017-05-02 ENCOUNTER — Ambulatory Visit (HOSPITAL_COMMUNITY): Payer: BC Managed Care – PPO

## 2017-05-02 DIAGNOSIS — M542 Cervicalgia: Secondary | ICD-10-CM | POA: Diagnosis not present

## 2017-05-02 DIAGNOSIS — M5412 Radiculopathy, cervical region: Secondary | ICD-10-CM

## 2017-05-02 DIAGNOSIS — R293 Abnormal posture: Secondary | ICD-10-CM

## 2017-05-02 NOTE — Therapy (Signed)
[name] [address] [place], [place], [address] Phone: [phone]   Fax:  [phone]  Physical Therapy Treatment  Patient Details  Name: Katherine OrleansVanessa L Fries MRN: [id number] Date of Birth: 10-Sep-1970 Referring Provider: Pearson GrippeJames Kim    Encounter Date: 05/02/2017  PT End of Session - 05/02/17 1129    Visit Number  5    Number of Visits  16    Date for PT Re-Evaluation  05/14/17    Authorization Type  [employer] [name]     Authorization Time Period  04/13/17-06/11/17    Authorization - Visit Number  5    Authorization - Number of Visits  16    PT Start Time  1122    PT Stop Time  1200    PT Time Calculation (min)  38 min    Equipment Utilized During Treatment  Gait belt    Activity Tolerance  Patient tolerated treatment well;No increased pain    Behavior During Therapy  WFL for tasks assessed/performed       Past Medical History:  Diagnosis Date  . Bronchitis   . Low back pain without sciatica 05/28/2015  . Migraines   . Vitamin D deficiency 06/02/2015    Past Surgical History:  Procedure Laterality Date  . ENDOMETRIAL ABLATION    . SHOULDER SURGERY    . TUBAL LIGATION      There were no vitals filed for this visit.  Subjective Assessment - 05/02/17 1126    Subjective  Pt reports she has been doing pretty good, still remains a little stiff. She has to drive to [place] suddenly, but took the train back. Her HEP helps her remain loose, not tight during the day. She continues to have a catch on the left side. She is unsure that dryneedling helped her acutely with mobility or pain, jus ttypical 24hrs soreness there after.     Currently in Pain?  No/denies                      [facility] - 05/02/17 0001      Neck Exercises: Standing   Other Standing Exercises  Bridge on transverse upper T-spine towel roll: 2x10 overhead squat with 5lb straight weight: 2x10      Neck Exercises: Seated   Other Seated  Exercise  -Quadruped shoulder taps with VC for scapular protraction: 2x10 alternating Standing shoulder flexion to end range, VC for vertical reac    Other Seated Exercise  -Seated Scapular retraction Ts:  2x10, VC for neutral cervical posture Standing Shoulder Abduction, 1x15 c 1lb weights, 2x10 c 2lb                PT Short Term Goals - 04/13/17 1250      PT SHORT TERM GOAL #1   Title  After 4 weeks patient will demonstrate improved cervical rotation ROM to 75 degree or greater.     Time  4    Period  Weeks    Status  New    Target Date  05/11/17      PT SHORT TERM GOAL #2   Title  After 4 week spatient will demonstrate improved chin to sternum ROM to 1 finger.     Time  4    Period  Weeks    Status  New    Target Date  05/11/17        PT Long Term Goals - 04/13/17 1300  PT LONG TERM GOAL #1   Title  After 8 weeks patient will demonstrate improved deep neck flexor endurance isomatric hold of 15 seconds while maintaining chinc tuck.     Time  8    Period  Weeks    Status  New    Target Date  06/11/17      PT LONG TERM GOAL #2   Title  After 8 weeks patient will demonstrate improved cervical ROM rotatiton>77 degrees bilat, flexion>50 degrees, and sidebending >35 degrees bilat.     Time  8    Period  Weeks    Status  New    Target Date  06/11/17      PT LONG TERM GOAL #3   Title  After 8 weeks patient will report improved tolerance to sitting while driving>60 minutes without aggravation of pain or symptoms.     Time  8    Period  Weeks    Status  New    Target Date  06/11/17      PT LONG TERM GOAL #4   Title  After 8 weeks patient will report decreased incidence of BUE paresthesia <1x/week.     Time  8    Period  Weeks    Status  New    Target Date  06/11/17      PT LONG TERM GOAL #5   Title  After 8 weeks patient will report return to AMB 2-38miles daily 3-5x weekly without exacerbation of pain or symptoms.     Time  8    Period  Weeks    Status   New    Target Date  06/11/17            Plan - 05/02/17 1133    Clinical Impression Statement  Progressed program this date to include moderate level scapular strengthening, careful not to aggravated neck pain/spasm. heavy verbal/visual cues given to maintain neutral.  Pt making good progress toward goals. She noted subjective weakness in shoulders. Thoracic curvature becomesmore apparent in quadruped, with additional interven this session to improve moobility and extension strength.     Rehab Potential  Good    PT Frequency  2x / week    PT Treatment/Interventions  ADLs/Self Care Home Management;Biofeedback;Cryotherapy;Electrical Stimulation;Moist Heat;Passive range of motion;Patient/family education;Gait training;Functional mobility training;Therapeutic activities;Therapeutic exercise;Dry needling    PT Next Visit Plan  P/ROM stretch adn manual as needed to capital flexion/rotation; continue with scapular strengthening.     PT Home Exercise Plan  Supine thoracic towel roll pec minor stretch, chintuck/neck retraction into 3 pillows; seated capital rotation mobilization, seate dcapital flexion nodding mobilization.  04/20/2017 - seated levator scapula stretch       Patient will benefit from skilled therapeutic intervention in order to improve the following deficits and impairments:  Hypomobility, Decreased endurance, Decreased knowledge of precautions, Obesity, Pain, Decreased strength, Decreased activity tolerance, Decreased range of motion, Improper body mechanics, Postural dysfunction  Visit Diagnosis: Cervicalgia  Radiculopathy, cervical region  Abnormal posture     Problem List Patient Active Problem List   Diagnosis Date Noted  . Well woman exam with routine gynecological exam 11/24/2016  . Vitamin D deficiency 06/02/2015  . Low back pain without sciatica 05/28/2015   12:00 PM, 05/02/17 [name], PT, DPT Physical Therapist at [facility] [phone] (office)      [name] 05/02/2017, 11:59 AM  [place] [facility] [address] [place], [place], [address]  Phone: [phone]   Fax:  [phone]  Name: ANSHU WEHNER MRN: [id number] Date of Birth: April 18, 1970

## 2017-05-03 ENCOUNTER — Encounter (HOSPITAL_COMMUNITY): Payer: BC Managed Care – PPO | Admitting: Physical Therapy

## 2017-05-05 ENCOUNTER — Ambulatory Visit (HOSPITAL_COMMUNITY): Payer: BC Managed Care – PPO

## 2017-05-05 ENCOUNTER — Telehealth (HOSPITAL_COMMUNITY): Payer: Self-pay

## 2017-05-05 NOTE — Telephone Encounter (Signed)
Called patient as she did not show for her appointment at 9:45 am. Patient stated she thought the appointment was at 11:15 am today. PT transferred patient to front office to reschedule appointment.   Katina DungBarbara D. Hartnett-Rands, MS, PT [facility] [place] [phone]

## 2017-05-09 ENCOUNTER — Ambulatory Visit (HOSPITAL_COMMUNITY): Payer: BC Managed Care – PPO | Attending: Internal Medicine

## 2017-05-09 DIAGNOSIS — M5412 Radiculopathy, cervical region: Secondary | ICD-10-CM | POA: Insufficient documentation

## 2017-05-09 DIAGNOSIS — R293 Abnormal posture: Secondary | ICD-10-CM | POA: Insufficient documentation

## 2017-05-09 DIAGNOSIS — M542 Cervicalgia: Secondary | ICD-10-CM | POA: Insufficient documentation

## 2017-05-09 NOTE — Therapy (Signed)
[place], [place], [address] Phone: [phone]   Fax:  [phone]  Physical Therapy Reassessment  Patient Details  Name: Katherine [name] MRN: [id number] Date of Birth: 04-Feb-1971 Referring Provider: Jani Gravel    Encounter Date: 05/09/2017  PT End of Session - 05/09/17 1221    Visit Number  6    Number of Visits  16    Date for PT Re-Evaluation  06/11/17    Authorization Type  [place] Time Period  04/13/17-06/11/17    Authorization - Visit Number  6    Authorization - Number of Visits  16    PT Start Time  1120    PT Stop Time  2992    PT Time Calculation (min)  39 min    Activity Tolerance  Patient tolerated treatment well;No increased pain    Behavior During Therapy  WFL for tasks assessed/performed       Past Medical History:  Diagnosis Date  . Bronchitis   . Low back pain without sciatica 05/28/2015  . Migraines   . Vitamin D deficiency 06/02/2015    Past Surgical History:  Procedure Laterality Date  . ENDOMETRIAL ABLATION    . SHOULDER SURGERY    . TUBAL LIGATION      There were no vitals filed for this visit.  Subjective Assessment - 05/09/17 1127    Subjective  Pt report she is doign pretty good. She says her neckpain is well manage4d, but she continues to have numbness and tingling in BUE when lying on her back, arms across stomach.    Limitations  Sitting    How long can you sit comfortably?  unchanged; 45 minutes in the car associated with tingling in hands/spasm at eval    How long can you stand comfortably?  not limited     How long can you walk comfortably?  Not limited     Currently in Pain?  No/denies         [facility] PT Assessment - 05/09/17 0001      Assessment   Medical Diagnosis  Chronic neck pain with subacute radiculopathy    Referring Provider  Jani Gravel     Onset Date/Surgical Date  -- Pain about 8YA, radiculopathy ~8WA    Hand Dominance  Right     Next MD Visit  as needed    Prior Therapy  None      Precautions   Precautions  None      Observation/Other Assessments   Focus on Therapeutic Outcomes (FOTO)   FOTO: 65 (35% limited)  51 (49% limited) at eval      AROM   Cervical Flexion  38 same as eval, loss of flexion at end range, uses protrusion    Cervical Extension  48  38 at eval    Cervical - Right Side Bend  31 degrees 30 at eval, increased right neck pain discomfort    Cervical - Left Side Bend  32 degrees 28 degreees at eval    Cervical - Right Rotation  67 63 degrees    Cervical - Left Rotation  68 degrees 58 at eval       EXAMINATION OTHER: Deep Palpation: 1. pec minor bilat--> WNL dorsal and axillary 2. Levator scapulae Right--> spastic and painful, dry needling produces pain different than CC 3. Right thoracic multifidus--> spastic and painful, dry needling produces pain different than CC PAIVM 1.  Spring testing T3/4--> similar to CC  -3x30sec Central PA mobilization, grade III, good response, tender but improves with time ULTT Ulnar Nerve- negative left, positive on the Right side, decreases with contralateral cervical lateral flexion.       PT Short Term Goals - 05/09/17 1137      PT SHORT TERM GOAL #1   Title  After 4 weeks patient will demonstrate improved cervical rotation ROM to 75 degree or greater.     Baseline  at reassessment, ~68 degrees bilat    Time  4    Period  Weeks    Status  Partially Met    Target Date  05/11/17      PT SHORT TERM GOAL #2   Title  After 4 week spatient will demonstrate improved chin to sternum ROM to 1 finger.     Baseline  no change since evaluation     Time  4    Period  Weeks    Status  New    Target Date  05/11/17        PT Long Term Goals - 04/13/17 1300      PT LONG TERM GOAL #1   Title  After 8 weeks patient will demonstrate improved deep neck flexor endurance isomatric hold of 15 seconds while maintaining chinc tuck.     Time  8    Period   Weeks    Status  New    Target Date  06/11/17      PT LONG TERM GOAL #2   Title  After 8 weeks patient will demonstrate improved cervical ROM rotatiton>77 degrees bilat, flexion>50 degrees, and sidebending >35 degrees bilat.     Time  8    Period  Weeks    Status  New    Target Date  06/11/17      PT LONG TERM GOAL #3   Title  After 8 weeks patient will report improved tolerance to sitting while driving>60 minutes without aggravation of pain or symptoms.     Time  8    Period  Weeks    Status  New    Target Date  06/11/17      PT LONG TERM GOAL #4   Title  After 8 weeks patient will report decreased incidence of BUE paresthesia <1x/week.     Time  8    Period  Weeks    Status  New    Target Date  06/11/17      PT LONG TERM GOAL #5   Title  After 8 weeks patient will report return to AMB 2-75mles daily 3-5x weekly without exacerbation of pain or symptoms.     Time  8    Period  Weeks    Status  New    Target Date  06/11/17            Plan - 05/09/17 1221    Clinical Impression Statement  Reassessment performed this date, pt demonstrating improved functional activitry tolerance overall. Pt reports good compliance with HEP and stretching. Pt reports good self management of neck pain, but continued to have intermittent numbness and tinglingin BUE, particularly at night while asleep. Examination reveals mild improvement in cervical ROM. FOTO indicates a significant decrease in limitations. partial or total completion of all short term goals, with progress made toward some long term goals. Additional examination this date demonstrates positive correlation of T spine mobilization with CC or pain, but palpation/stretch of pec minor is WNL. Dry  needling of tight thoracic multifidus and Right sided spastic levator scap are negative for symptoms reproduction. ULTT of ulnar nerve is positive on the right side only, improved with contrlateral cervical lateral flexion.     Rehab Potential   Good    PT Frequency  2x / week    PT Duration  8 weeks    PT Treatment/Interventions  ADLs/Self Care Home Management;Biofeedback;Cryotherapy;Electrical Stimulation;Moist Heat;Passive range of motion;Patient/family education;Gait training;Functional mobility training;Therapeutic activities;Therapeutic exercise;Dry needling    PT Next Visit Plan  Continue to work on capital flexion/rotation ROM, upper thoracic mobility, upper thoracic extension strength, periscapular loading, deep neck flexor endurance.     PT Home Exercise Plan  Supine thoracic towel roll pec minor stretch, chintuck/neck retraction into 3 pillows; seated capital rotation mobilization, seate dcapital flexion nodding mobilization.  04/20/2017 - seated levator scapula stretch    Consulted and Agree with Plan of Care  Patient       Patient will benefit from skilled therapeutic intervention in order to improve the following deficits and impairments:  Hypomobility, Decreased endurance, Decreased knowledge of precautions, Obesity, Pain, Decreased strength, Decreased activity tolerance, Decreased range of motion, Improper body mechanics, Postural dysfunction  Visit Diagnosis: Cervicalgia  Radiculopathy, cervical region  Abnormal posture     Problem List Patient Active Problem List   Diagnosis Date Noted  . Well woman exam with routine gynecological exam 11/24/2016  . Vitamin D deficiency 06/02/2015  . Low back pain without sciatica 05/28/2015   12:43 PM, 05/09/17 Etta Grandchild, PT, DPT Physical Therapist - [place] [phone] (Office)   Etta Grandchild 05/09/2017, 12:38 PM  [place] [address] [place], [place], [address] Phone: [phone]   Fax:  [phone]  Name: NEELA ZECCA MRN: [id number] Date of Birth: [birth year]

## 2017-05-10 ENCOUNTER — Encounter (HOSPITAL_COMMUNITY): Payer: BC Managed Care – PPO | Admitting: Physical Therapy

## 2017-05-12 ENCOUNTER — Ambulatory Visit (HOSPITAL_COMMUNITY): Payer: BC Managed Care – PPO | Admitting: Physical Therapy

## 2017-05-12 ENCOUNTER — Telehealth (HOSPITAL_COMMUNITY): Payer: Self-pay | Admitting: Physical Therapy

## 2017-05-12 ENCOUNTER — Telehealth (HOSPITAL_COMMUNITY): Payer: Self-pay | Admitting: Internal Medicine

## 2017-05-12 NOTE — Telephone Encounter (Signed)
05/12/17  Pt called at 11:03 to say that she forgot to call and cx... She has a virus she said

## 2017-05-12 NOTE — Telephone Encounter (Signed)
Attempted to call pt re missed appointment line went directly to a busy signal.  Virgina Organynthia Russell, PT CLT [phone]

## 2017-05-16 ENCOUNTER — Ambulatory Visit (HOSPITAL_COMMUNITY): Payer: BC Managed Care – PPO

## 2017-05-16 ENCOUNTER — Other Ambulatory Visit (HOSPITAL_COMMUNITY): Payer: Self-pay | Admitting: Internal Medicine

## 2017-05-16 ENCOUNTER — Telehealth (HOSPITAL_COMMUNITY): Payer: Self-pay

## 2017-05-16 DIAGNOSIS — Z1231 Encounter for screening mammogram for malignant neoplasm of breast: Secondary | ICD-10-CM

## 2017-05-16 NOTE — Telephone Encounter (Signed)
Patient had to cancel for today she has the flu

## 2017-05-17 ENCOUNTER — Encounter (HOSPITAL_COMMUNITY): Payer: BC Managed Care – PPO | Admitting: Physical Therapy

## 2017-05-18 ENCOUNTER — Encounter (HOSPITAL_COMMUNITY): Payer: Self-pay

## 2017-05-18 ENCOUNTER — Ambulatory Visit (HOSPITAL_COMMUNITY)
Admission: RE | Admit: 2017-05-18 | Discharge: 2017-05-18 | Disposition: A | Discharge status: Home / Self Care | Payer: BC Managed Care – PPO | Source: Ambulatory Visit | Attending: Internal Medicine | Admitting: Internal Medicine

## 2017-05-18 DIAGNOSIS — R928 Other abnormal and inconclusive findings on diagnostic imaging of breast: Secondary | ICD-10-CM | POA: Insufficient documentation

## 2017-05-18 DIAGNOSIS — Z1231 Encounter for screening mammogram for malignant neoplasm of breast: Secondary | ICD-10-CM

## 2017-05-18 DIAGNOSIS — N6489 Other specified disorders of breast: Secondary | ICD-10-CM | POA: Diagnosis not present

## 2017-05-19 ENCOUNTER — Ambulatory Visit (HOSPITAL_COMMUNITY): Payer: BC Managed Care – PPO | Admitting: Physical Therapy

## 2017-05-19 ENCOUNTER — Other Ambulatory Visit: Payer: Self-pay

## 2017-05-19 ENCOUNTER — Encounter (HOSPITAL_COMMUNITY): Payer: Self-pay | Admitting: Physical Therapy

## 2017-05-19 DIAGNOSIS — M542 Cervicalgia: Secondary | ICD-10-CM | POA: Diagnosis not present

## 2017-05-19 DIAGNOSIS — M5412 Radiculopathy, cervical region: Secondary | ICD-10-CM

## 2017-05-19 DIAGNOSIS — R293 Abnormal posture: Secondary | ICD-10-CM

## 2017-05-19 NOTE — Therapy (Signed)
[place], [place], [address] Phone: [phone]   Fax:  [phone]  Physical Therapy Treatment  Patient Details  Name: Katherine [name] MRN: [id number] Date of Birth: July 28, 1970 Referring Provider: Jani Gravel   Encounter Date: 05/19/2017  PT End of Session - 05/19/17 1132    Visit Number  7    Number of Visits  16    Date for PT Re-Evaluation  06/11/17    Authorization Type  [place] Time Period  04/13/17-06/11/17    Authorization - Visit Number  7    Authorization - Number of Visits  16    PT Start Time  1120    PT Stop Time  1200    PT Time Calculation (min)  40 min    Activity Tolerance  Patient tolerated treatment well;No increased pain    Behavior During Therapy  WFL for tasks assessed/performed       Past Medical History:  Diagnosis Date  . Bronchitis   . Low back pain without sciatica 05/28/2015  . Migraines   . Vitamin D deficiency 06/02/2015    Past Surgical History:  Procedure Laterality Date  . ENDOMETRIAL ABLATION    . SHOULDER SURGERY    . TUBAL LIGATION      There were no vitals filed for this visit.  Subjective Assessment - 05/19/17 1122    Subjective  PT states that she had the flu.  She states that her neck is feeling pretty good.  No pain but is having numbness in both arms  She has been sick so she has not been able to do any of the exercises     Limitations  Sitting    How long can you sit comfortably?  unchanged; 45 minutes in the car associated with tingling in hands/spasm at eval    How long can you stand comfortably?  not limited     How long can you walk comfortably?  Not limited     Currently in Pain?  No/denies         [facility] PT Assessment - 05/19/17 0001      Assessment   Medical Diagnosis  Chronic neck pain with subacute radiculopathy    Referring Provider  Jani Gravel    Onset Date/Surgical Date  -- Pain about 8YA, radiculopathy ~8WA    Hand  Dominance  Right    Next MD Visit  as needed    Prior Therapy  None      Precautions   Precautions  None      Balance Screen   Has the patient fallen in the past 6 months  No    Has the patient had a decrease in activity level because of a fear of falling?   No    Is the patient reluctant to leave their home because of a fear of falling?   No      Observation/Other Assessments   Focus on Therapeutic Outcomes (FOTO)   FOTO: 65 (35% limited)  51 (49% limited) at eval      Sensation   Light Touch  Appears Intact      Coordination   Gross Motor Movements are Fluid and Coordinated  No    Coordination and Movement Description  loss of coordinated cervical and capital mvt in sagittal plane      Posture/Postural Control   Posture/Postural Control  Postural limitations    Postural Limitations  Rounded Shoulders;Forward head    Posture Comments  limited core endurance, errect sittng limited to <5 minutes      AROM   Overall AROM   -- Ulnar nn. tension tenst: (-); Phalen's/reverse (-)    Cervical Flexion  45 same as eval, loss of flexion at end range, uses protrusion    Cervical Extension  65 38 at eval    Cervical - Right Side Bend  35 30 at eval, increased right neck pain discomfort    Cervical - Left Side Bend  45 28 degreees at eval    Cervical - Right Rotation  70 63 degrees    Cervical - Left Rotation  70 58 at eval      Strength   Overall Strength  -- DTR: C5-7 WNL    Right Hand Gross Grasp  Functional      Palpation   Spinal mobility  --      Special Tests    Special Tests  Cervical    Cervical Tests  --      Spurling's   Findings  Negative    Side  Right    Comment  Pain                   [facility] Adult PT Treatment/Exercise - 05/19/17 0001      Exercises   Exercises  Neck      Neck Exercises: Theraband   Scapula Retraction  10 reps;Green    Shoulder Extension  10 reps;Green    Rows  10 reps;Green      Neck Exercises: Standing   Wall Push Ups  10  reps    Other Standing Exercises  B UE flexion at wall stabilizing spine x 5      Neck Exercises: Seated   Other Seated Exercise  3 D cervical excursions       Neck Exercises: Supine   Other Supine Exercise  supine longitudinal thoracic towel roll pec minor stretch: 1x15 minutes scapular protraciton on towel roll      Neck Exercises: Prone   Axial Exension  10 reps    W Back  10 reps      Manual Therapy   Manual Therapy  Joint mobilization    Manual therapy comments  done seperate from all other aspects of treatment     Joint Mobilization  T8-T2  Grade I and II       Neck Exercises: Stretches   Corner Stretch  3 reps;30 seconds    Lower Cervical/Upper Thoracic Stretch  3 reps;10 seconds    Other Neck Stretches  posterior capsule shoulder stretch and backward shoulder circles                PT Short Term Goals - 05/09/17 1137      PT SHORT TERM GOAL #1   Title  After 4 weeks patient will demonstrate improved cervical rotation ROM to 75 degree or greater.     Baseline  at reassessment, ~68 degrees bilat    Time  4    Period  Weeks    Status  Partially Met    Target Date  05/11/17      PT SHORT TERM GOAL #2   Title  After 4 week spatient will demonstrate improved chin to sternum ROM to 1 finger.     Baseline  no change since evaluation     Time  4    Period  Weeks    Status  New    Target Date  05/11/17        PT Long Term Goals - 04/13/17 1300      PT LONG TERM GOAL #1   Title  After 8 weeks patient will demonstrate improved deep neck flexor endurance isomatric hold of 15 seconds while maintaining chinc tuck.     Time  8    Period  Weeks    Status  New    Target Date  06/11/17      PT LONG TERM GOAL #2   Title  After 8 weeks patient will demonstrate improved cervical ROM rotatiton>77 degrees bilat, flexion>50 degrees, and sidebending >35 degrees bilat.     Time  8    Period  Weeks    Status  New    Target Date  06/11/17      PT LONG TERM GOAL #3    Title  After 8 weeks patient will report improved tolerance to sitting while driving>60 minutes without aggravation of pain or symptoms.     Time  8    Period  Weeks    Status  New    Target Date  06/11/17      PT LONG TERM GOAL #4   Title  After 8 weeks patient will report decreased incidence of BUE paresthesia <1x/week.     Time  8    Period  Weeks    Status  New    Target Date  06/11/17      PT LONG TERM GOAL #5   Title  After 8 weeks patient will report return to AMB 2-55mles daily 3-5x weekly without exacerbation of pain or symptoms.     Time  8    Period  Weeks    Status  New    Target Date  06/11/17            Plan - 05/19/17 1209    Clinical Impression Statement  Ms. Roper returns after having the flu.  She is pain free at this time.  Pt ROM has improved greatly therefore session began concentrating on cervical stability exercises and improving upper thoracic mobility.     Rehab Potential  Good    PT Frequency  2x / week    PT Duration  8 weeks    PT Treatment/Interventions  ADLs/Self Care Home Management;Biofeedback;Cryotherapy;Electrical Stimulation;Moist Heat;Passive range of motion;Patient/family education;Gait training;Functional mobility training;Therapeutic activities;Therapeutic exercise;Dry needling    PT Next Visit Plan  Begin thoracic excursion exercises     PT Home Exercise Plan  Supine thoracic towel roll pec minor stretch, chintuck/neck retraction into 3 pillows; seated capital rotation mobilization, seate dcapital flexion nodding mobilization.  04/20/2017 - seated levator scapula stretch    Consulted and Agree with Plan of Care  Patient       Patient will benefit from skilled therapeutic intervention in order to improve the following deficits and impairments:  Hypomobility, Decreased endurance, Decreased knowledge of precautions, Obesity, Pain, Decreased strength, Decreased activity tolerance, Decreased range of motion, Improper body mechanics, Postural  dysfunction  Visit Diagnosis: Cervicalgia  Radiculopathy, cervical region  Abnormal posture     Problem List Patient Active Problem List   Diagnosis Date Noted  . Well woman exam with routine gynecological exam 11/24/2016  . Vitamin D deficiency 06/02/2015  . Low back pain without sciatica 05/28/2015    CRayetta Humphrey PT CLT [phone]/14/2019, 12:12 PM  [place] [address]Phone:  [phone]   Fax:  [phone]  Name: LAIKLYNN RACZYNSKI MRN: [id number] Date of Birth: 1970/08/14

## 2017-05-23 ENCOUNTER — Ambulatory Visit (HOSPITAL_COMMUNITY): Payer: BC Managed Care – PPO

## 2017-05-23 DIAGNOSIS — M542 Cervicalgia: Secondary | ICD-10-CM

## 2017-05-23 DIAGNOSIS — M5412 Radiculopathy, cervical region: Secondary | ICD-10-CM

## 2017-05-23 DIAGNOSIS — R293 Abnormal posture: Secondary | ICD-10-CM

## 2017-05-23 NOTE — Therapy (Signed)
[facility] [address] [place], [place], [address] Phone: [phone]   Fax:  [phone]  Physical Therapy Treatment  Patient Details  Name: Katherine [name] MRN: [id number] Date of Birth: 05-Nov-1970 Referring Provider: Jani Gravel   Encounter Date: 05/23/2017  PT End of Session - 05/23/17 1146    Visit Number  8    Number of Visits  16    Date for PT Re-Evaluation  06/11/17    Authorization Type  [place] Time Period  04/13/17-06/11/17    Authorization - Visit Number  8    Authorization - Number of Visits  16    PT Start Time  1696    PT Stop Time  1205    PT Time Calculation (min)  40 min    Equipment Utilized During Treatment  Gait belt    Activity Tolerance  Patient tolerated treatment well;No increased pain    Behavior During Therapy  WFL for tasks assessed/performed       Past Medical History:  Diagnosis Date  . Bronchitis   . Low back pain without sciatica 05/28/2015  . Migraines   . Vitamin D deficiency 06/02/2015    Past Surgical History:  Procedure Laterality Date  . ENDOMETRIAL ABLATION    . SHOULDER SURGERY    . TUBAL LIGATION      There were no vitals filed for this visit.  Subjective Assessment - 05/23/17 1146    Subjective  Pt feeling bettersince having Flu. She say no pain today. Knee is botrhering her some, but neck is feeling pretty good.     Currently in Pain?  No/denies         Interventions today:  1. Prone Pec Major Trunk Rotation Stretch : 3x30sec 2. Supine Thoracic half-roll stretch "T"- 1x10mn 3. Supine Scapular protraction: 10lb free-weights 2x10, CGA fior safety  4. Supine on half roll, Overhead reach flexion with physio ball 1x15 (wand flexion) 5. Supine cervical retraction 1x10, VC for heavy effort  6. Standing Scap elevation "shrug' with 10lb free weights 2x10x3sec 7. Standing Shoulder Abduction, 2lb free weights: 2x10 8. Kneeling bridge, UE brachiation over  centralized line (stepping over) 2x10  9. Forward flexed in chair: Long Arc horizontal abduction of shoulder + scapular retraction 2x12 10. Overhead Chair Squats with PVC tubule: 2x10         PT Short Term Goals - 05/09/17 1137      PT SHORT TERM GOAL #1   Title  After 4 weeks patient will demonstrate improved cervical rotation ROM to 75 degree or greater.     Baseline  at reassessment, ~68 degrees bilat    Time  4    Period  Weeks    Status  Partially Met    Target Date  05/11/17      PT SHORT TERM GOAL #2   Title  After 4 week spatient will demonstrate improved chin to sternum ROM to 1 finger.     Baseline  no change since evaluation     Time  4    Period  Weeks    Status  New    Target Date  05/11/17        PT Long Term Goals - 04/13/17 1300      PT LONG TERM GOAL #1   Title  After 8 weeks patient will demonstrate improved deep neck flexor endurance isomatric hold of 15 seconds while maintaining chinc tuck.  Time  8    Period  Weeks    Status  New    Target Date  06/11/17      PT LONG TERM GOAL #2   Title  After 8 weeks patient will demonstrate improved cervical ROM rotatiton>77 degrees bilat, flexion>50 degrees, and sidebending >35 degrees bilat.     Time  8    Period  Weeks    Status  New    Target Date  06/11/17      PT LONG TERM GOAL #3   Title  After 8 weeks patient will report improved tolerance to sitting while driving>60 minutes without aggravation of pain or symptoms.     Time  8    Period  Weeks    Status  New    Target Date  06/11/17      PT LONG TERM GOAL #4   Title  After 8 weeks patient will report decreased incidence of BUE paresthesia <1x/week.     Time  8    Period  Weeks    Status  New    Target Date  06/11/17      PT LONG TERM GOAL #5   Title  After 8 weeks patient will report return to AMB 2-26mles daily 3-5x weekly without exacerbation of pain or symptoms.     Time  8    Period  Weeks    Status  New    Target Date  06/11/17             Plan - 05/23/17 1149    Clinical Impression Statement  Progressing strength activity in shoulders thoracici spine and neck. Pt making good progress. Able to add in more full body closed chain activity for shoulder this session.     Rehab Potential  Good    PT Frequency  2x / week    PT Duration  8 weeks    PT Treatment/Interventions  ADLs/Self Care Home Management;Biofeedback;Cryotherapy;Electrical Stimulation;Moist Heat;Passive range of motion;Patient/family education;Gait training;Functional mobility training;Therapeutic activities;Therapeutic exercise;Dry needling    PT Next Visit Plan  Repeat currnet program. Progress resistance as able.     PT Home Exercise Plan  Supine thoracic towel roll pec minor stretch, chintuck/neck retraction into 3 pillows; seated capital rotation mobilization, seate dcapital flexion nodding mobilization.  04/20/2017 - seated levator scapula stretch    Consulted and Agree with Plan of Care  Patient       Patient will benefit from skilled therapeutic intervention in order to improve the following deficits and impairments:  Hypomobility, Decreased endurance, Decreased knowledge of precautions, Obesity, Pain, Decreased strength, Decreased activity tolerance, Decreased range of motion, Improper body mechanics, Postural dysfunction  Visit Diagnosis: Cervicalgia  Radiculopathy, cervical region  Abnormal posture     Problem List Patient Active Problem List   Diagnosis Date Noted  . Well woman exam with routine gynecological exam 11/24/2016  . Vitamin D deficiency 06/02/2015  . Low back pain without sciatica 05/28/2015    11:56 AM, 05/23/17 AEtta Grandchild PT, DPT [facility] [phone](office)     BEtta Grandchild2/18/2019, 11:55 AM  [place] [address]Phone: [phone]  Fax:  [phone] Name:  Katherine EUSTICEMRN: [id number] of Birth: 4September 16, 1972

## 2017-05-24 ENCOUNTER — Encounter (HOSPITAL_COMMUNITY): Payer: BC Managed Care – PPO | Admitting: Physical Therapy

## 2017-05-25 ENCOUNTER — Other Ambulatory Visit (HOSPITAL_COMMUNITY): Payer: Self-pay | Admitting: Internal Medicine

## 2017-05-25 DIAGNOSIS — R928 Other abnormal and inconclusive findings on diagnostic imaging of breast: Secondary | ICD-10-CM

## 2017-05-26 ENCOUNTER — Ambulatory Visit (HOSPITAL_COMMUNITY): Payer: BC Managed Care – PPO

## 2017-05-26 ENCOUNTER — Telehealth (HOSPITAL_COMMUNITY): Payer: Self-pay

## 2017-05-26 DIAGNOSIS — R293 Abnormal posture: Secondary | ICD-10-CM

## 2017-05-26 DIAGNOSIS — M542 Cervicalgia: Secondary | ICD-10-CM

## 2017-05-26 DIAGNOSIS — M5412 Radiculopathy, cervical region: Secondary | ICD-10-CM

## 2017-05-26 NOTE — Therapy (Signed)
PHYSICAL THERAPY DISCHARGE SUMMARY  Visits from Start of Care: 9  Current functional level related to goals / functional outcomes: All met. See below.    Remaining deficits: *see below    Education / Equipment: N/A Plan: Patient agrees to discharge.  Patient goals were partially met. Patient is being discharged due to being pleased with the current functional level.  ?????         12:23 PM, 05/26/17 Etta Grandchild, PT, DPT Physical Therapist at [facility] [phone] (office)       ----------------------------------------------------------------------------------------------------------------------       [facility] [place], [place], [address] Phone: [phone]   Fax:  [phone]  Physical Therapy Treatment  Patient Details  Name: Katherine [name] MRN: [id number] Date of Birth: [birth year] Referring Provider: Jani Gravel    Encounter Date: 05/26/2017  PT End of Session - 05/26/17 1210    Visit Number  9    Number of Visits  16    Date for PT Re-Evaluation  06/11/17    Authorization Type  [place] Time Period  04/13/17-06/11/17    Authorization - Visit Number  9    Authorization - Number of Visits  16    PT Start Time  5170    PT Stop Time  1206    PT Time Calculation (min)  48 min    Activity Tolerance  Patient tolerated treatment well;No increased pain    Behavior During Therapy  WFL for tasks assessed/performed       Past Medical History:  Diagnosis Date  . Bronchitis   . Low back pain without sciatica 05/28/2015  . Migraines   . Vitamin D deficiency 06/02/2015    Past Surgical History:  Procedure Laterality Date  . ENDOMETRIAL ABLATION    . SHOULDER SURGERY    . TUBAL LIGATION      There were no vitals filed for this visit.  Subjective Assessment - 05/26/17 1134    Subjective  Pt reports she is doing well today. She tolerated  her session last time very well, except for long arc plank activity which was uncomfortable.     How long can you sit comfortably?  unchanged; 45 minutes in the car associated with tingling in hands/spasm at eval    How long can you stand comfortably?  not limited     How long can you walk comfortably?  Not limited     Currently in Pain?  No/denies         [facility] PT Assessment - 05/26/17 0001      Assessment   Medical Diagnosis  Chronic neck pain with subacute radiculopathy    Referring Provider  Jani Gravel       Observation/Other Assessments   Focus on Therapeutic Outcomes (FOTO)   FOTO: 79 (21% impaired)       Sensation   Light Touch  Appears Intact      AROM   Overall AROM   -- Ulnar nn. tension tenst: (-); Phalen's/reverse (-)    Overall AROM Comments  all assessed on 2/14    Cervical Flexion  45 same as eval, loss of flexion at end range, uses protrusion    Cervical Extension  65 38 at eval    Cervical - Right Side Bend  35 30 at eval, increased right neck pain discomfort    Cervical - Left Side Bend  45  28 degreees at eval    Cervical - Right Rotation  70 63 degrees    Cervical - Left Rotation  70 58 at eval          [facility] Adult PT Treatment/Exercise - 05/26/17 0001      Neck Exercises: Standing   Other Standing Exercises  shoulder abduction: 1x10 with 3lb weights Shrugs: 1x10 with 10lb free weights      Neck Exercises: Supine   Other Supine Exercise  supine longitudinal Tx roll pec minor stretch: 8 minutes transverse foam roll wand flexion with phys ball: 1x20      Neck Exercises: Prone   Other Prone Exercise  Prone Scap retraction "goal posts": 2x12 Prone Y Scaption: 2x10 bilat         PT Education - 05/26/17 1209    Education provided  Yes    Education Details  DC HEP for gym     Person(s) Educated  Patient    Methods  Explanation    Comprehension  Verbalized understanding       PT Short Term Goals - 05/26/17 1216      PT SHORT TERM GOAL #1   Title   After 4 weeks patient will demonstrate improved cervical rotation ROM to 75 degree or greater.     Time  4    Period  Weeks    Status  Partially Met      PT SHORT TERM GOAL #2   Title  After 4 week spatient will demonstrate improved chin to sternum ROM to 1 finger.     Baseline  no change since evaluation     Time  4    Period  Weeks    Status  On-going        PT Long Term Goals - 05/26/17 1217      PT LONG TERM GOAL #1   Title  After 8 weeks patient will demonstrate improved deep neck flexor endurance isometric hold of 15 seconds while maintaining chinc tuck.     Time  8    Period  Weeks    Status  Achieved      PT LONG TERM GOAL #2   Title  After 8 weeks patient will demonstrate improved cervical ROM rotatiton>77 degrees bilat, flexion>50 degrees, and sidebending >35 degrees bilat.     Time  8    Period  Weeks    Status  Partially Met      PT LONG TERM GOAL #3   Title  After 8 weeks patient will report improved tolerance to sitting while driving>60 minutes without aggravation of pain or symptoms.     Time  8    Period  Weeks    Status  Partially Met      PT LONG TERM GOAL #4   Title  After 8 weeks patient will report decreased incidence of BUE paresthesia <1x/week.     Time  8    Status  Partially Met      PT LONG TERM GOAL #5   Title  After 8 weeks patient will report return to AMB 2-29mles daily 3-5x weekly without exacerbation of pain or symptoms.     Time  8    Period  Weeks    Status  Unable to assess            Plan - 05/26/17 1211    Clinical Impression Statement  Pt prepared for DC this date. All gaosl met. Significan timprovements noted in beginning  and ending FOTO scores. Pt is independent with new HEP and ready to move into the gym for continued strengthening.     Clinical Presentation  Stable    Clinical Presentation due to:  tests and measures    Rehab Potential  Good    PT Treatment/Interventions  ADLs/Self Care Home  Management;Biofeedback;Cryotherapy;Electrical Stimulation;Moist Heat;Passive range of motion;Patient/family education;Gait training;Functional mobility training;Therapeutic activities;Therapeutic exercise;Dry needling    PT Home Exercise Plan  Supine thoracic towel roll pec minor stretch, chintuck/neck retraction into 3 pillows; seated capital rotation mobilization, seate dcapital flexion nodding mobilization.  04/20/2017 - seated levator scapula stretch    Consulted and Agree with Plan of Care  Patient       Patient will benefit from skilled therapeutic intervention in order to improve the following deficits and impairments:  Hypomobility, Decreased endurance, Decreased knowledge of precautions, Obesity, Pain, Decreased strength, Decreased activity tolerance, Decreased range of motion, Improper body mechanics, Postural dysfunction  Visit Diagnosis: Cervicalgia  Radiculopathy, cervical region  Abnormal posture     Problem List Patient Active Problem List   Diagnosis Date Noted  . Well woman exam with routine gynecological exam 11/24/2016  . Vitamin D deficiency 06/02/2015  . Low back pain without sciatica 05/28/2015  12:18 PM, 05/26/17 Etta Grandchild, PT, DPT Physical Therapist at [facility] [phone] (office)      Etta Grandchild 05/26/2017, 12:18 PM  [facility] [address] [place], [place], [address] Phone: [phone]   Fax:  [phone]  Name: Katherine [name] MRN: [id number] Date of Birth: 09/04/1970

## 2017-05-30 ENCOUNTER — Ambulatory Visit (HOSPITAL_COMMUNITY): Payer: BC Managed Care – PPO

## 2017-05-31 ENCOUNTER — Encounter (HOSPITAL_COMMUNITY): Payer: BC Managed Care – PPO | Admitting: Physical Therapy

## 2017-06-02 ENCOUNTER — Encounter (HOSPITAL_COMMUNITY): Payer: BC Managed Care – PPO | Admitting: Physical Therapy

## 2017-06-07 ENCOUNTER — Ambulatory Visit (HOSPITAL_COMMUNITY)
Admission: RE | Admit: 2017-06-07 | Discharge: 2017-06-07 | Disposition: A | Discharge status: Home / Self Care | Payer: BC Managed Care – PPO | Source: Ambulatory Visit | Attending: Internal Medicine | Admitting: Internal Medicine

## 2017-06-07 DIAGNOSIS — R928 Other abnormal and inconclusive findings on diagnostic imaging of breast: Secondary | ICD-10-CM | POA: Diagnosis present

## 2017-06-07 DIAGNOSIS — N6313 Unspecified lump in the right breast, lower outer quadrant: Secondary | ICD-10-CM | POA: Diagnosis not present

## 2017-06-07 DIAGNOSIS — N6489 Other specified disorders of breast: Secondary | ICD-10-CM | POA: Diagnosis present

## 2017-09-09 ENCOUNTER — Other Ambulatory Visit: Payer: Self-pay

## 2017-09-09 ENCOUNTER — Emergency Department (HOSPITAL_COMMUNITY): Payer: BC Managed Care – PPO

## 2017-09-09 ENCOUNTER — Encounter (HOSPITAL_COMMUNITY): Payer: Self-pay | Admitting: Emergency Medicine

## 2017-09-09 ENCOUNTER — Emergency Department (HOSPITAL_COMMUNITY)
Admission: EM | Admit: 2017-09-09 | Discharge: 2017-09-10 | Disposition: A | Discharge status: Home / Self Care | Payer: BC Managed Care – PPO | Attending: Emergency Medicine | Admitting: Emergency Medicine

## 2017-09-09 DIAGNOSIS — R42 Dizziness and giddiness: Secondary | ICD-10-CM | POA: Diagnosis not present

## 2017-09-09 DIAGNOSIS — Z7982 Long term (current) use of aspirin: Secondary | ICD-10-CM | POA: Insufficient documentation

## 2017-09-09 DIAGNOSIS — Z79899 Other long term (current) drug therapy: Secondary | ICD-10-CM | POA: Diagnosis not present

## 2017-09-09 DIAGNOSIS — R51 Headache: Secondary | ICD-10-CM | POA: Diagnosis present

## 2017-09-09 LAB — CBC WITH DIFFERENTIAL/PLATELET
BASOS ABS: 0 10*3/uL (ref 0.0–0.1)
BASOS PCT: 0 %
EOS ABS: 0.1 10*3/uL (ref 0.0–0.7)
EOS PCT: 1 %
HCT: 36.5 % (ref 36.0–46.0)
Hemoglobin: 12.3 g/dL (ref 12.0–15.0)
Lymphocytes Relative: 15 %
Lymphs Abs: 1.4 10*3/uL (ref 0.7–4.0)
MCH: 29.8 pg (ref 26.0–34.0)
MCHC: 33.7 g/dL (ref 30.0–36.0)
MCV: 88.4 fL (ref 78.0–100.0)
MONO ABS: 0.4 10*3/uL (ref 0.1–1.0)
Monocytes Relative: 5 %
Neutro Abs: 7.2 10*3/uL (ref 1.7–7.7)
Neutrophils Relative %: 79 %
PLATELETS: 248 10*3/uL (ref 150–400)
RBC: 4.13 MIL/uL (ref 3.87–5.11)
RDW: 12.5 % (ref 11.5–15.5)
WBC: 9.1 10*3/uL (ref 4.0–10.5)

## 2017-09-09 LAB — COMPREHENSIVE METABOLIC PANEL
ALBUMIN: 4.5 g/dL (ref 3.5–5.0)
ALK PHOS: 78 U/L (ref 38–126)
ALT: 21 U/L (ref 14–54)
AST: 19 U/L (ref 15–41)
Anion gap: 6 (ref 5–15)
BILIRUBIN TOTAL: 0.8 mg/dL (ref 0.3–1.2)
BUN: 17 mg/dL (ref 6–20)
CALCIUM: 9.8 mg/dL (ref 8.9–10.3)
CO2: 26 mmol/L (ref 22–32)
Chloride: 109 mmol/L (ref 101–111)
Creatinine, Ser: 1.18 mg/dL — ABNORMAL HIGH (ref 0.44–1.00)
GFR calc Af Amer: >60 mL/min (ref >60)
GFR, EST NON AFRICAN AMERICAN: 54 mL/min — AB (ref >60)
Glucose, Bld: 150 mg/dL — ABNORMAL HIGH (ref 65–99)
POTASSIUM: 3.6 mmol/L (ref 3.5–5.1)
Sodium: 141 mmol/L (ref 135–145)
TOTAL PROTEIN: 7.7 g/dL (ref 6.5–8.1)

## 2017-09-09 MED ORDER — MECLIZINE HCL 12.5 MG PO TABS
25.0000 mg | ORAL_TABLET | Freq: Once | ORAL | Status: AC
  Administered 2017-09-09: 25 mg via ORAL
  Filled 2017-09-09: qty 2

## 2017-09-09 NOTE — ED Provider Notes (Signed)
[facility] EMERGENCY DEPARTMENT Provider Note   CSN: [id number] Arrival date & time: 09/09/17  2058     History   Chief Complaint Chief Complaint  Patient presents with  . Headache    HPI Katherine [name] is a 47 y.o. female.  Patient is a 47 year old female who presents to the emergency department with a complaint of headache, nausea, and dizziness.  The patient states that she was in her usual state of health until about 7:30 PM when she began to have sensation as though she were "drunk".  She says she felt off balance as though she were falling, and things seem to be moving.  At times she felt as though she had a difficult time focusing on what she was looking at.  She became nauseated and it was difficult to attempt to eat.  She describes the headache as a heavy sensation mostly on the left side of her frontal area.    The history is provided by the patient.  Headache   This is a new problem. Associated symptoms include nausea. Pertinent negatives include no palpitations and no shortness of breath.    Past Medical History:  Diagnosis Date  . Bronchitis   . Low back pain without sciatica 05/28/2015  . Migraines   . Vitamin D deficiency 06/02/2015    Patient Active Problem List   Diagnosis Date Noted  . Well woman exam with routine gynecological exam 11/24/2016  . Vitamin D deficiency 06/02/2015  . Low back pain without sciatica 05/28/2015    Past Surgical History:  Procedure Laterality Date  . ENDOMETRIAL ABLATION    . SHOULDER SURGERY    . TUBAL LIGATION       OB History    Gravida  2   Para  1   Term  0   Preterm  1   AB  1   Living  1     SAB      TAB  1   Ectopic      Multiple      Live Births  1            Home Medications    Prior to Admission medications   Medication Sig Start Date End Date Taking? Authorizing Provider  aspirin EC 81 MG tablet Take 81 mg by mouth daily.    [provider]  Cholecalciferol (VITAMIN D3)  5000 units CAPS Take by mouth daily.    [provider]  lovastatin (MEVACOR) 20 MG tablet Take 20 mg by mouth at bedtime.    [provider]  propranolol (INDERAL) 80 MG tablet Take 160 mg by mouth daily.  01/09/16   [provider]  rizatriptan (MAXALT) 10 MG tablet Take 10 mg by mouth as needed.  09/09/16   [provider]  vitamin B-12 (CYANOCOBALAMIN) 1000 MCG tablet Take 1,000 mcg by mouth daily.    [provider]    Family History Family History  Problem Relation Age of Onset  . Dementia Mother   . Other Mother        issue with eyes  . Cancer Father        colon, kidney  . Hypertension Sister   . Other Son        frequent ear infections; optical nerve and optical cup enlarged  . Dementia Maternal Grandmother   . Alcohol abuse Maternal Grandfather   . Leukemia Paternal Grandmother   . Cancer Paternal Grandfather  lung  . Alcohol abuse Paternal Grandfather     Social History Social History   Tobacco Use  . Smoking status: Never Smoker  . Smokeless tobacco: Never Used  Substance Use Topics  . Alcohol use: No  . Drug use: No     Allergies   Patient has no known allergies.   Review of Systems Review of Systems  Constitutional: Negative for activity change.       All ROS Neg except as noted in HPI  HENT: Negative for nosebleeds.   Eyes: Negative for photophobia and discharge.  Respiratory: Negative for cough, shortness of breath and wheezing.   Cardiovascular: Negative for chest pain and palpitations.  Gastrointestinal: Positive for nausea. Negative for abdominal pain and blood in stool.  Genitourinary: Negative for dysuria, frequency and hematuria.  Musculoskeletal: Negative for arthralgias, back pain and neck pain.  Skin: Negative.   Neurological: Positive for dizziness and headaches. Negative for seizures and speech difficulty.  Psychiatric/Behavioral: Negative for confusion and hallucinations.      Physical Exam Updated Vital Signs BP 138/85 (BP Location: Left Arm)   Pulse 68   Temp 97.9 F (36.6 C) (Oral)   Resp 16   Ht 5\' 7"  (1.702 m)   Wt 89.8 kg (198 lb)   SpO2 100%   BMI 31.01 kg/m   Physical Exam  Constitutional: She appears well-developed and well-nourished. No distress.  HENT:  Head: Normocephalic and atraumatic.  Right Ear: External ear normal.  Left Ear: External ear normal.  Eyes: Conjunctivae are normal. Right eye exhibits no discharge. Left eye exhibits no discharge. No scleral icterus.  Neck: Neck supple. No tracheal deviation present.  Cardiovascular: Normal rate, regular rhythm and intact distal pulses.  Pulmonary/Chest: Effort normal and breath sounds normal. No stridor. No respiratory distress. She has no wheezes. She has no rales.  Abdominal: Soft. Bowel sounds are normal. She exhibits no distension. There is no tenderness. There is no rebound and no guarding.  Musculoskeletal: She exhibits no edema or tenderness.  Neurological: She is alert. She has normal strength. No cranial nerve deficit (no facial droop, extraocular movements intact, no slurred speech) or sensory deficit. She exhibits normal muscle tone. She displays no seizure activity. Coordination normal.  Pt c/o sensation of dizziness with certain movement during examination. No focal deficit.  Skin: Skin is warm and dry. No rash noted.  Psychiatric: She has a normal mood and affect.  Nursing note and vitals reviewed.    ED Treatments / Results  Labs (all labs ordered are listed, but only abnormal results are displayed) Labs Reviewed  COMPREHENSIVE METABOLIC PANEL  CBC WITH DIFFERENTIAL/PLATELET  URINALYSIS, ROUTINE W REFLEX MICROSCOPIC    EKG None  Radiology No results found.  Procedures Procedures (including critical care time)  Medications Ordered in ED Medications  meclizine (ANTIVERT) tablet 25 mg (25 mg Oral Given 09/09/17 2302)     Initial Impression / Assessment  and Plan / ED Course  I have reviewed the triage vital signs and the nursing notes.  Pertinent labs & imaging results that were available during my care of the patient were reviewed by me and considered in my medical decision making (see chart for details).       Final Clinical Impressions(s) / ED Diagnoses MDM Vital signs stable during exam. Pt is awake and alert, no distress Pt will be treated with antivert in the ED.  Glucose elevated at 150, and creat. 1.18, otherwise Cmet wnl. UA neg for infection. No dehydration  noted. Anion gap wnl at 6. CBC wnl. EKG - NSR.  CT head unremarkable for intracranial problem.  Discussed findings with patient. Pt feeling some better after medication.  Plan for Rx of antivert. Advised pt to change positions slowly. Pt to follow up with Dr Selena BattenKim in the office as soon as possible. Pt to return to the ED if any changes or problem.   Final diagnoses:  Vertigo    ED Discharge Orders        Ordered    meclizine (ANTIVERT) 25 MG tablet  3 times daily PRN     09/10/17 0107       Ivery QualeBryant, Pedram Goodchild, PA-C 09/13/17 1405    Derwood KaplanNanavati, Ankit, MD 09/14/17 [phone]

## 2017-09-09 NOTE — ED Triage Notes (Signed)
Pt C/O headache and nausea that started around 1930. Pt has taken Rizatriptan around 2030 with minimal relief. Pt also C/O dizziness.

## 2017-09-10 LAB — URINALYSIS, ROUTINE W REFLEX MICROSCOPIC
Bilirubin Urine: NEGATIVE
GLUCOSE, UA: NEGATIVE mg/dL
Hgb urine dipstick: NEGATIVE
KETONES UR: NEGATIVE mg/dL
LEUKOCYTES UA: NEGATIVE
NITRITE: NEGATIVE
PROTEIN: NEGATIVE mg/dL
Specific Gravity, Urine: 1.005 (ref 1.005–1.030)
pH: 7 (ref 5.0–8.0)

## 2017-09-10 MED ORDER — MECLIZINE HCL 25 MG PO TABS: 25.0000 mg | ORAL_TABLET | Freq: Three times a day (TID) | ORAL | 0 refills | Status: AC | PRN

## 2017-09-10 NOTE — Discharge Instructions (Addendum)
Your lab work and your vital signs are all within normal limits.  CT scan of your head is negative for stroke, mass, or other intracranial abnormality.  I suspect that your dizziness and headache possibly related to vertigo.  Please use Tylenol every 4 hours, and/or ibuprofen every 6 hours for discomfort.  Please use Antivert 3 times daily.  Please discuss your symptoms and the medicines prescribed tonight with Dr. Selena BattenKim, or member of his team.  Please return to the emergency department if any changes in your condition, problems, or concerns.

## 2018-03-13 ENCOUNTER — Other Ambulatory Visit (HOSPITAL_COMMUNITY): Payer: Self-pay | Admitting: Internal Medicine

## 2018-03-13 DIAGNOSIS — Z1231 Encounter for screening mammogram for malignant neoplasm of breast: Secondary | ICD-10-CM

## 2018-06-15 ENCOUNTER — Other Ambulatory Visit: Payer: Self-pay | Admitting: Obstetrics and Gynecology

## 2018-06-15 ENCOUNTER — Other Ambulatory Visit (HOSPITAL_COMMUNITY)
Admission: RE | Admit: 2018-06-15 | Discharge: 2018-06-15 | Disposition: A | Discharge status: Home / Self Care | Payer: BC Managed Care – PPO | Source: Ambulatory Visit | Attending: Obstetrics and Gynecology | Admitting: Obstetrics and Gynecology

## 2018-06-15 ENCOUNTER — Encounter: Payer: Self-pay | Admitting: Obstetrics and Gynecology

## 2018-06-15 ENCOUNTER — Ambulatory Visit (INDEPENDENT_AMBULATORY_CARE_PROVIDER_SITE_OTHER): Payer: BC Managed Care – PPO | Admitting: Obstetrics and Gynecology

## 2018-06-15 ENCOUNTER — Other Ambulatory Visit: Payer: Self-pay

## 2018-06-15 VITALS — BP 116/71 | HR 67 | Ht 67.0 in | Wt 192.8 lb

## 2018-06-15 DIAGNOSIS — Z01419 Encounter for gynecological examination (general) (routine) without abnormal findings: Secondary | ICD-10-CM

## 2018-06-15 NOTE — Progress Notes (Signed)
Assessment:  Annual Gyn Exam, every 3 year schedule History of right breast concern 2019, for repeat mammogram probable 3D, tomorrow Serum FSH and estradiol ordered Plan:  1. pap smear done, next pap due 3 years 2. return annually or prn 3    Annual mammogram advised after age 49, to be done tomorrow patient wishes to discuss results and has been advised to review my note after I review the results, call my office if there is any questions Subjective:  Katherine [name] is a 48 y.o. female G2P0111 who presents for annual exam. No LMP recorded. Patient has had an ablation. The patient has complaints today of concerns over mammogram which will be repeated tomorrow.  She states that she had a small area of concern in the right breast last year but no biopsies were done.  She has questions about whether she should consider mastectomy.  I have advised her to get the mammogram which is already scheduled for tomorrow, and discuss further after mammogram.  Normally biopsies might be considered if there are calcifications but mastectomy on a prophylactic basis for someone with a low risk  is rarely indicated  The following portions of the patient's history were reviewed and updated as appropriate: allergies, current medications, past family history Family history: Father had colon cancer, nephew and nephew's mother had lung cancer  , past medical history, past social history, past surgical history and problem list. Past Medical History:  Diagnosis Date  . Bronchitis   . Low back pain without sciatica 05/28/2015  . Migraines   . Vitamin D deficiency 06/02/2015    Past Surgical History:  Procedure Laterality Date  . ENDOMETRIAL ABLATION    . SHOULDER SURGERY    . TUBAL LIGATION       Current Outpatient Medications:  .  aspirin EC 81 MG tablet, Take 81 mg by mouth daily., Disp: , Rfl:  .  Cholecalciferol (VITAMIN D3) 5000 units CAPS, Take by mouth daily., Disp: , Rfl:  .  lovastatin (MEVACOR) 20  MG tablet, Take 20 mg by mouth at bedtime., Disp: , Rfl:  .  propranolol (INDERAL) 80 MG tablet, Take 160 mg by mouth daily. , Disp: , Rfl: 0 .  rizatriptan (MAXALT) 10 MG tablet, Take 10 mg by mouth as needed. , Disp: , Rfl: 0 .  meclizine (ANTIVERT) 25 MG tablet, Take 1 tablet (25 mg total) by mouth 3 (three) times daily as needed for dizziness. (Patient not taking: Reported on 06/15/2018), Disp: 21 tablet, Rfl: 0 .  vitamin B-12 (CYANOCOBALAMIN) 1000 MCG tablet, Take 1,000 mcg by mouth daily., Disp: , Rfl:   Review of Systems Constitutional: negative Gastrointestinal: negative Genitourinary: Amenorrhea since endometrial ablation.  She is unsure if she is postmenopausal  Objective:  BP 116/71 (BP Location: Left Arm, Patient Position: Sitting, Cuff Size: Normal)   Pulse 67   Ht 5\' 7"  (1.702 m)   Wt 192 lb 12.8 oz (87.5 kg)   BMI 30.20 kg/m    BMI: Body mass index is 30.2 kg/m.  General Appearance: Alert, appropriate appearance for age. No acute distress HEENT: Grossly normal Neck / Thyroid:  Cardiovascular: RRR; normal S1, S2, no murmur Lungs: CTA bilaterally Back: No CVAT Breast Exam: No dimpling, nipple retraction or discharge. No masses or nodes.,  There is a minimal amount of thickness on the right side in the normal glandular area at 10:00 that is mobile there is no fixation or other concern based on physical exam.  Mammogram is  advised as planned already and No masses or nodes.No dimpling, nipple retraction or discharge. Gastrointestinal: Soft, non-tender, no masses or organomegaly Pelvic Exam: Vulva and vagina appear normal. Bimanual exam reveals normal uterus and adnexa. Urinary system: urethral meatus normal Cervix: normal appearance and thin prep PAP obtained Adnexa: normal bimanual exam Rectovaginal: normal rectal, no masses and guaiac negative stool obtained Lymphatic Exam: Non-palpable nodes in neck, clavicular, axillary, or inguinal regions Skin: no rash or  abnormalities Neurologic: Normal gait and speech, no tremor  Psychiatric: Alert and oriented, appropriate affect.  Urinalysis: Not done  Christin Bach. MD Pgr [phone] 10:30 AM

## 2018-06-16 LAB — FOLLICLE STIMULATING HORMONE: FSH: 114.8 m[IU]/mL

## 2018-06-16 LAB — ESTRADIOL: Estradiol: 12.3 pg/mL

## 2018-06-19 LAB — CYTOLOGY - PAP
Chlamydia: NEGATIVE
Diagnosis: NEGATIVE
HPV (WINDOPATH): NOT DETECTED
NEISSERIA GONORRHEA: NEGATIVE

## 2018-06-21 ENCOUNTER — Telehealth: Payer: Self-pay | Admitting: *Deleted

## 2018-06-21 NOTE — Telephone Encounter (Signed)
Pt notified normal pap, negative HPV. May repeat pap in 3 yrs.  Verbalized understanding.

## 2018-06-23 ENCOUNTER — Telehealth: Payer: Self-pay | Admitting: Obstetrics and Gynecology

## 2018-06-23 NOTE — Telephone Encounter (Signed)
PT aware that FSH values are clearly postmenopausal, and she should expect no further menses. Future bleeding would be abnormal. Pt is not having vasomotor symptoms that would warrant therapy.

## 2018-06-26 ENCOUNTER — Ambulatory Visit (HOSPITAL_COMMUNITY): Payer: BC Managed Care – PPO

## 2018-07-24 ENCOUNTER — Ambulatory Visit (HOSPITAL_COMMUNITY): Payer: BC Managed Care – PPO

## 2018-08-14 ENCOUNTER — Other Ambulatory Visit: Payer: Self-pay

## 2018-08-14 ENCOUNTER — Telehealth: Payer: Self-pay | Admitting: Obstetrics and Gynecology

## 2018-08-14 ENCOUNTER — Ambulatory Visit (HOSPITAL_COMMUNITY)
Admission: RE | Admit: 2018-08-14 | Discharge: 2018-08-14 | Disposition: A | Discharge status: Home / Self Care | Payer: BC Managed Care – PPO | Source: Ambulatory Visit | Attending: Internal Medicine | Admitting: Internal Medicine

## 2018-08-14 DIAGNOSIS — Z1231 Encounter for screening mammogram for malignant neoplasm of breast: Secondary | ICD-10-CM | POA: Diagnosis present

## 2018-08-14 NOTE — Telephone Encounter (Signed)
Pt informed of normal mammogram.

## 2018-09-22 ENCOUNTER — Other Ambulatory Visit: Payer: Self-pay

## 2018-09-22 ENCOUNTER — Other Ambulatory Visit: Payer: Self-pay | Admitting: Internal Medicine

## 2018-09-22 DIAGNOSIS — Z20822 Contact with and (suspected) exposure to covid-19: Secondary | ICD-10-CM

## 2018-09-24 LAB — NOVEL CORONAVIRUS, NAA: SARS-CoV-2, NAA: NOT DETECTED

## 2019-01-02 ENCOUNTER — Encounter: Payer: Self-pay | Admitting: *Deleted

## 2019-01-17 ENCOUNTER — Other Ambulatory Visit: Payer: Self-pay

## 2019-01-17 ENCOUNTER — Ambulatory Visit (INDEPENDENT_AMBULATORY_CARE_PROVIDER_SITE_OTHER): Payer: Self-pay | Admitting: *Deleted

## 2019-01-17 DIAGNOSIS — Z1211 Encounter for screening for malignant neoplasm of colon: Secondary | ICD-10-CM

## 2019-01-17 MED ORDER — PEG 3350-KCL-NA BICARB-NACL 420 G PO SOLR: 4000.0000 mL | Freq: Once | ORAL | 0 refills | Status: AC

## 2019-01-17 NOTE — Patient Instructions (Signed)
Katherine [name]   04-Dec-1970 MRN: [id number]    Procedure Date: 03/21/2019 Time to register: 12:00 pm Place to register: [facility] Procedure Time: 1:00 pm Scheduled provider: Dr. Gala Romney  PREPARATION FOR COLONOSCOPY WITH TRI-LYTE SPLIT PREP  Please notify us immediately if you are diabetic, take iron supplements, or if you are on Coumadin or any other blood thinners.   You will need to purchase 1 fleet enema and 1 box of Bisacodyl '5mg'$  tablets.   2 DAYS BEFORE PROCEDURE:  DATE: 03/19/2019   DAY: Monday Begin clear liquid diet AFTER your lunch meal. NO SOLID FOODS after this point.  1 DAY BEFORE PROCEDURE:  DATE: 03/20/2019   DAY: Tuesday Continue clear liquids the entire day - NO SOLID FOOD.   At 2:00 pm:  Take 2 Bisacodyl tablets.   At 4:00pm:  Start drinking your solution. Make sure you mix well per instructions on the bottle. Try to drink 1 (one) 8 ounce glass every 10-15 minutes until you have consumed HALF the jug. You should complete by 6:00pm.You must keep the left over solution refrigerated until completed next day.  Continue clear liquids. You must drink plenty of clear liquids to prevent dehyration and kidney failure.     DAY OF PROCEDURE:   DATE: 03/21/2019  DAY: Wednesday If you take medications for your heart, blood pressure or breathing, you may take these medications.   Five hours before your procedure time @ 8:00 am:  Finish remaining amout of bowel prep, drinking 1 (one) 8 ounce glass every 10-15 minutes until complete. You have two hours to consume remaining prep.   Three hours before your procedure time @ 10:00 am :  Nothing by mouth.   At least one hour before going to the hospital:  Give yourself one Fleet enema. You may take your morning medications with sip of water unless we have instructed otherwise.      Please see below for Dietary Information.  CLEAR LIQUIDS INCLUDE:  Water Jello (NOT red in color)   Ice Popsicles (NOT red in color)    Tea (sugar ok, no milk/cream) Powdered fruit flavored drinks  Coffee (sugar ok, no milk/cream) Gatorade/ Lemonade/ Kool-Aid  (NOT red in color)   Juice: apple, white grape, white cranberry Soft drinks  Clear bullion, consomme, broth (fat free beef/chicken/vegetable)  Carbonated beverages (any kind)  Strained chicken noodle soup Hard Candy   Remember: Clear liquids are liquids that will allow you to see your fingers on the other side of a clear glass. Be sure liquids are NOT red in color, and not cloudy, but CLEAR.  DO NOT EAT OR DRINK ANY OF THE FOLLOWING:  Dairy products of any kind   Cranberry juice Tomato juice / V8 juice   Grapefruit juice Orange juice     Red grape juice  Do not eat any solid foods, including such foods as: cereal, oatmeal, yogurt, fruits, vegetables, creamed soups, eggs, bread, crackers, pureed foods in a blender, etc.   HELPFUL HINTS FOR DRINKING PREP SOLUTION:   Make sure prep is extremely cold. Mix and refrigerate the the morning of the prep. You may also put in the freezer.   You may try mixing some Crystal Light or Country Time Lemonade if you prefer. Mix in small amounts; add more if necessary.  Try drinking through a straw  Rinse mouth with water or a mouthwash between glasses, to remove after-taste.  Try sipping on a cold beverage /ice/ popsicles between glasses of  prep.  Place a piece of sugar-free hard candy in mouth between glasses.  If you become nauseated, try consuming smaller amounts, or stretch out the time between glasses. Stop for 30-60 minutes, then slowly start back drinking.        OTHER INSTRUCTIONS  You will need a responsible adult at least 49 years of age to accompany you and drive you home. This person must remain in the waiting room during your procedure. The hospital will cancel your procedure if you do not have a responsible adult with you.   1. Wear loose fitting clothing that is easily removed. 2. Leave jewelry and  other valuables at home.  3. Remove all body piercing jewelry and leave at home. 4. Total time from sign-in until discharge is approximately 2-3 hours. 5. You should go home directly after your procedure and rest. You can resume normal activities the day after your procedure. 6. The day of your procedure you should not:  Drive  Make legal decisions  Operate machinery  Drink alcohol  Return to work   You may call the office (Dept: [phone]) before 5:00pm, or page the doctor on call ([phone]) after 5:00pm, for further instructions, if necessary.   Insurance Information YOU WILL NEED TO CHECK WITH YOUR INSURANCE COMPANY FOR THE BENEFITS OF COVERAGE YOU HAVE FOR THIS PROCEDURE.  UNFORTUNATELY, NOT ALL INSURANCE COMPANIES HAVE BENEFITS TO COVER ALL OR PART OF THESE TYPES OF PROCEDURES.  IT IS YOUR RESPONSIBILITY TO CHECK YOUR BENEFITS, HOWEVER, WE WILL BE GLAD TO ASSIST YOU WITH ANY CODES YOUR INSURANCE COMPANY MAY NEED.    PLEASE NOTE THAT MOST INSURANCE COMPANIES WILL NOT COVER A SCREENING COLONOSCOPY FOR PEOPLE UNDER THE AGE OF 50  IF YOU HAVE BCBS INSURANCE, YOU MAY HAVE BENEFITS FOR A SCREENING COLONOSCOPY BUT IF POLYPS ARE FOUND THE DIAGNOSIS WILL CHANGE AND THEN YOU MAY HAVE A DEDUCTIBLE THAT WILL NEED TO BE MET. SO PLEASE MAKE SURE YOU CHECK YOUR BENEFITS FOR A SCREENING COLONOSCOPY AS WELL AS A DIAGNOSTIC COLONOSCOPY.  

## 2019-01-17 NOTE — Progress Notes (Signed)
Gastroenterology Pre-Procedure Review  Request Date: 01/17/2019 Requesting Physician: Denyce Robert, NP @ [facility], no previous TCS  PATIENT REVIEW QUESTIONS: The patient responded to the following health history questions as indicated:    1. Diabetes Melitis: no 2. Joint replacements in the past 12 months: no 3. Major health problems in the past 3 months: no 4. Has an artificial valve or MVP: no 5. Has a defibrillator: no 6. Has been advised in past to take antibiotics in advance of a procedure like teeth cleaning: no 7. Family history of colon cancer: yes, dad age: 67  8. Alcohol Use: yes, 1 mixed drink a month 9. Illicit drug Use: no 10. History of sleep apnea: no  11. History of coronary artery or other vascular stents placed within the last 12 months: no 12. History of any prior anesthesia complications: no 13. There is no height or weight on file to calculate BMI.ht: 5'7 wt: 187 lbs    MEDICATIONS & ALLERGIES:    Patient reports the following regarding taking any blood thinners:   Plavix? no Aspirin? yes Coumadin? no Brilinta? no Xarelto? no Eliquis? no Pradaxa? no Savaysa? no Effient? no  Patient confirms/reports the following medications:  Current Outpatient Medications  Medication Sig Dispense Refill  . aspirin EC 81 MG tablet Take 81 mg by mouth daily.    . Cholecalciferol (VITAMIN D3) 5000 units CAPS Take by mouth daily.    . fluticasone (FLONASE) 50 MCG/ACT nasal spray Place into both nostrils as needed for allergies or rhinitis.    Marland Kitchen ibuprofen (ADVIL) 800 MG tablet Take 800 mg by mouth as needed.    . lovastatin (MEVACOR) 20 MG tablet Take 20 mg by mouth at bedtime.    . propranolol (INDERAL) 80 MG tablet Take 160 mg by mouth daily.   0  . rizatriptan (MAXALT) 10 MG tablet Take 10 mg by mouth as needed.   0  . meclizine (ANTIVERT) 25 MG tablet Take 1 tablet (25 mg total) by mouth 3 (three) times daily as needed for dizziness. (Patient not taking:  Reported on 06/15/2018) 21 tablet 0   No current facility-administered medications for this visit.     Patient confirms/reports the following allergies:  No Known Allergies  No orders of the defined types were placed in this encounter.   AUTHORIZATION INFORMATION Primary Insurance: BCBS [place],  [place] #:[id number] ,  Group #:[id number] Pre-Cert / Auth required: No, not required  SCHEDULE INFORMATION: Procedure has been scheduled as follows:  Date: 03/21/2019, Time: 1:00 Location: [facility] with Dr. Gala Romney  This Gastroenterology Pre-Precedure Review Form is being routed to the following provider(s): Walden Field, NP

## 2019-01-18 NOTE — Progress Notes (Signed)
Ok to schedule.

## 2019-01-18 NOTE — Addendum Note (Signed)
Addended by: Metro Kung on: 01/18/2019 03:19 PM   Modules accepted: Orders, SmartSet

## 2019-03-19 ENCOUNTER — Other Ambulatory Visit: Payer: Self-pay

## 2019-03-19 ENCOUNTER — Other Ambulatory Visit (HOSPITAL_COMMUNITY)
Admission: RE | Admit: 2019-03-19 | Discharge: 2019-03-19 | Disposition: A | Discharge status: Home / Self Care | Payer: BC Managed Care – PPO | Source: Ambulatory Visit | Attending: Internal Medicine | Admitting: Internal Medicine

## 2019-03-19 DIAGNOSIS — Z01812 Encounter for preprocedural laboratory examination: Secondary | ICD-10-CM | POA: Insufficient documentation

## 2019-03-19 DIAGNOSIS — Z20828 Contact with and (suspected) exposure to other viral communicable diseases: Secondary | ICD-10-CM | POA: Diagnosis not present

## 2019-03-19 LAB — SARS CORONAVIRUS 2 (TAT 6-24 HRS): SARS Coronavirus 2: NEGATIVE

## 2019-03-20 ENCOUNTER — Telehealth: Payer: Self-pay | Admitting: *Deleted

## 2019-03-20 NOTE — Telephone Encounter (Signed)
Called patient. She has moved procedure time up to 9:30am, arrival 8:30am. Aware to drink 2nd half of prep at 4:30am, npo 6:30am. Endo aware

## 2019-03-21 ENCOUNTER — Encounter (HOSPITAL_COMMUNITY): Payer: Self-pay | Admitting: Internal Medicine

## 2019-03-21 ENCOUNTER — Ambulatory Visit (HOSPITAL_COMMUNITY)
Admission: RE | Admit: 2019-03-21 | Discharge: 2019-03-21 | Disposition: A | Discharge status: Home / Self Care | Payer: BC Managed Care – PPO | Attending: Internal Medicine | Admitting: Internal Medicine

## 2019-03-21 ENCOUNTER — Encounter (HOSPITAL_COMMUNITY)
Admission: RE | Disposition: A | Discharge status: Home / Self Care | Payer: Self-pay | Source: Home / Self Care | Attending: Internal Medicine

## 2019-03-21 ENCOUNTER — Other Ambulatory Visit: Payer: Self-pay

## 2019-03-21 DIAGNOSIS — Z79899 Other long term (current) drug therapy: Secondary | ICD-10-CM | POA: Insufficient documentation

## 2019-03-21 DIAGNOSIS — G43909 Migraine, unspecified, not intractable, without status migrainosus: Secondary | ICD-10-CM | POA: Diagnosis not present

## 2019-03-21 DIAGNOSIS — Z820 Family history of epilepsy and other diseases of the nervous system: Secondary | ICD-10-CM | POA: Insufficient documentation

## 2019-03-21 DIAGNOSIS — Z791 Long term (current) use of non-steroidal anti-inflammatories (NSAID): Secondary | ICD-10-CM | POA: Insufficient documentation

## 2019-03-21 DIAGNOSIS — M545 Low back pain: Secondary | ICD-10-CM | POA: Diagnosis not present

## 2019-03-21 DIAGNOSIS — Z801 Family history of malignant neoplasm of trachea, bronchus and lung: Secondary | ICD-10-CM | POA: Insufficient documentation

## 2019-03-21 DIAGNOSIS — E559 Vitamin D deficiency, unspecified: Secondary | ICD-10-CM | POA: Diagnosis not present

## 2019-03-21 DIAGNOSIS — E7800 Pure hypercholesterolemia, unspecified: Secondary | ICD-10-CM | POA: Diagnosis not present

## 2019-03-21 DIAGNOSIS — Z7982 Long term (current) use of aspirin: Secondary | ICD-10-CM | POA: Diagnosis not present

## 2019-03-21 DIAGNOSIS — Z811 Family history of alcohol abuse and dependence: Secondary | ICD-10-CM | POA: Insufficient documentation

## 2019-03-21 DIAGNOSIS — Z8249 Family history of ischemic heart disease and other diseases of the circulatory system: Secondary | ICD-10-CM | POA: Diagnosis not present

## 2019-03-21 DIAGNOSIS — Z800 Family history of malignant neoplasm of digestive organs: Secondary | ICD-10-CM | POA: Diagnosis not present

## 2019-03-21 DIAGNOSIS — Z1211 Encounter for screening for malignant neoplasm of colon: Secondary | ICD-10-CM

## 2019-03-21 DIAGNOSIS — Z8051 Family history of malignant neoplasm of kidney: Secondary | ICD-10-CM | POA: Insufficient documentation

## 2019-03-21 HISTORY — PX: COLONOSCOPY: SHX5424

## 2019-03-21 HISTORY — DX: Pure hypercholesterolemia, unspecified: E78.00

## 2019-03-21 HISTORY — DX: Migraine, unspecified, not intractable, without status migrainosus: G43.909

## 2019-03-21 SURGERY — COLONOSCOPY
Anesthesia: Moderate Sedation

## 2019-03-21 MED ORDER — ONDANSETRON HCL 4 MG/2ML IJ SOLN
INTRAMUSCULAR | Status: DC | PRN
  Administered 2019-03-21: 4 mg via INTRAVENOUS

## 2019-03-21 MED ORDER — MIDAZOLAM HCL 5 MG/5ML IJ SOLN
INTRAMUSCULAR | Status: DC | PRN
  Administered 2019-03-21 (×2): 1 mg via INTRAVENOUS
  Administered 2019-03-21: 2 mg via INTRAVENOUS
  Administered 2019-03-21: 1 mg via INTRAVENOUS
  Administered 2019-03-21: 2 mg via INTRAVENOUS

## 2019-03-21 MED ORDER — ONDANSETRON HCL 4 MG/2ML IJ SOLN
INTRAMUSCULAR | Status: AC
  Filled 2019-03-21: qty 2

## 2019-03-21 MED ORDER — SODIUM CHLORIDE 0.9 % IV SOLN: INTRAVENOUS | Status: DC

## 2019-03-21 MED ORDER — MEPERIDINE HCL 50 MG/ML IJ SOLN
INTRAMUSCULAR | Status: AC
  Filled 2019-03-21: qty 1

## 2019-03-21 MED ORDER — MEPERIDINE HCL 100 MG/ML IJ SOLN
INTRAMUSCULAR | Status: DC | PRN
  Administered 2019-03-21: 10 mg via INTRAVENOUS
  Administered 2019-03-21: 15 mg via INTRAVENOUS
  Administered 2019-03-21: 25 mg via INTRAVENOUS

## 2019-03-21 MED ORDER — STERILE WATER FOR IRRIGATION IR SOLN
Status: DC | PRN
  Administered 2019-03-21 (×2): 2.5 mL

## 2019-03-21 MED ORDER — MIDAZOLAM HCL 5 MG/5ML IJ SOLN
INTRAMUSCULAR | Status: AC
  Filled 2019-03-21: qty 10

## 2019-03-21 NOTE — H&P (Signed)
@LOGO @   Primary Care Physician:  , MD Primary Gastroenterologist:  Dr. Pearson Grippe  Pre-Procedure History & Physical: HPI:  Katherine [name] is a 48 y.o. female is here for a screening colonoscopy.  First ever screening examination.  No bowel symptoms.  Family history colon cancer in patient's father who was diagnosed at age 48.  Past Medical History:  Diagnosis Date  . Bronchitis   . Hypercholesteremia   . Low back pain without sciatica 05/28/2015  . Migraine   . Migraines   . Vitamin D deficiency 06/02/2015    Past Surgical History:  Procedure Laterality Date  . ENDOMETRIAL ABLATION    . SHOULDER SURGERY    . TUBAL LIGATION      Prior to Admission medications   Medication Sig Start Date End Date Taking? Authorizing Provider  aspirin EC 81 MG tablet Take 81 mg by mouth daily.   Yes [provider]  Cholecalciferol (VITAMIN D3) 5000 units CAPS Take 5,000 Units by mouth daily.    Yes [provider]  fluticasone (FLONASE) 50 MCG/ACT nasal spray Place 1 spray into both nostrils daily as needed for allergies or rhinitis.    Yes [provider]  lovastatin (MEVACOR) 20 MG tablet Take 20 mg by mouth at bedtime.   Yes [provider]  propranolol (INDERAL) 80 MG tablet Take 160 mg by mouth daily.  01/09/16  Yes [provider]  ibuprofen (ADVIL) 800 MG tablet Take 800 mg by mouth every 8 (eight) hours as needed (pain).     [provider]  meclizine (ANTIVERT) 25 MG tablet Take 1 tablet (25 mg total) by mouth 3 (three) times daily as needed for dizziness. 09/10/17   11/10/17, PA-C  rizatriptan (MAXALT) 10 MG tablet Take 10 mg by mouth as needed for migraine.  09/09/16   [provider]    Allergies as of 01/18/2019  . (No Known Allergies)    Family History  Problem Relation Age of Onset  . Dementia Mother   . Other Mother        issue with eyes  . Cancer Father        colon, kidney  . Colon cancer Father   .  Hypertension Sister   . Other Son        frequent ear infections; optical nerve and optical cup enlarged  . Dementia Maternal Grandmother   . Alcohol abuse Maternal Grandfather   . Leukemia Paternal Grandmother   . Cancer Paternal Grandfather        lung  . Alcohol abuse Paternal Grandfather     Social History   Socioeconomic History  . Marital status: Single    Spouse name: Not on file  . Number of children: Not on file  . Years of education: Not on file  . Highest education level: Not on file  Occupational History  . Not on file  Tobacco Use  . Smoking status: Never Smoker  . Smokeless tobacco: Never Used  Substance and Sexual Activity  . Alcohol use: No  . Drug use: No  . Sexual activity: Not Currently    Birth control/protection: Surgical    Comment: tubal and ablation  Other Topics Concern  . Not on file  Social History Narrative  . Not on file   Social Determinants of Health   Financial Resource Strain:   . Difficulty of Paying Living Expenses: Not on file  Food Insecurity:   . Worried About 01/20/2019 of  Food in the Last Year: Not on file  . Ran Out of Food in the Last Year: Not on file  Transportation Needs:   . Lack of Transportation (Medical): Not on file  . Lack of Transportation (Non-Medical): Not on file  Physical Activity:   . Days of Exercise per Week: Not on file  . Minutes of Exercise per Session: Not on file  Stress:   . Feeling of Stress : Not on file  Social Connections:   . Frequency of Communication with Friends and Family: Not on file  . Frequency of Social Gatherings with Friends and Family: Not on file  . Attends Religious Services: Not on file  . Active Member of Clubs or Organizations: Not on file  . Attends Archivist Meetings: Not on file  . Marital Status: Not on file  Intimate Partner Violence:   . Fear of Current or Ex-Partner: Not on file  . Emotionally Abused: Not on file  . Physically Abused: Not on file  .  Sexually Abused: Not on file    Review of Systems: See HPI, otherwise negative ROS  Physical Exam: BP 106/75   Pulse 64   Temp 99 F (37.2 C) (Oral)   Resp 14   Ht 5\' 7"  (1.702 m)   Wt 86.2 kg   SpO2 100%   BMI 29.76 kg/m  General:   Alert,  Well-developed, well-nourished, pleasant and cooperative in NAD Lungs:  Clear throughout to auscultation.   No wheezes, crackles, or rhonchi. No acute distress. Heart:  Regular rate and rhythm; no murmurs, clicks, rubs,  or gallops. Abdomen:  Soft, nontender and nondistended. No masses, hepatosplenomegaly or hernias noted. Normal bowel sounds, without guarding, and without rebound.    Impression/Plan: Katherine [name] is now here to undergo a screening colonoscopy.  Average risk screening colonoscopy. Risks, benefits, limitations, imponderables and alternatives regarding colonoscopy have been reviewed with the patient. Questions have been answered. All parties agreeable.     Notice:  This dictation was prepared with Dragon dictation along with smaller phrase technology. Any transcriptional errors that result from this process are unintentional and may not be corrected upon review.

## 2019-03-21 NOTE — Discharge Instructions (Signed)
°  Colonoscopy Discharge Instructions  Read the instructions outlined below and refer to this sheet in the next few weeks. These discharge instructions provide you with general information on caring for yourself after you leave the hospital. Your doctor may also give you specific instructions. While your treatment has been planned according to the most current medical practices available, unavoidable complications occasionally occur. If you have any problems or questions after discharge, call Dr. Gala Romney at [phone]. ACTIVITY  You may resume your regular activity, but move at a slower pace for the next 24 hours.   Take frequent rest periods for the next 24 hours.   Walking will help get rid of the air and reduce the bloated feeling in your belly (abdomen).   No driving for 24 hours (because of the medicine (anesthesia) used during the test).    Do not sign any important legal documents or operate any machinery for 24 hours (because of the anesthesia used during the test).  NUTRITION  Drink plenty of fluids.   You may resume your normal diet as instructed by your doctor.   Begin with a light meal and progress to your normal diet. Heavy or fried foods are harder to digest and may make you feel sick to your stomach (nauseated).   Avoid alcoholic beverages for 24 hours or as instructed.  MEDICATIONS  You may resume your normal medications unless your doctor tells you otherwise.  WHAT YOU CAN EXPECT TODAY  Some feelings of bloating in the abdomen.   Passage of more gas than usual.   Spotting of blood in your stool or on the toilet paper.  IF YOU HAD POLYPS REMOVED DURING THE COLONOSCOPY:  No aspirin products for 7 days or as instructed.   No alcohol for 7 days or as instructed.   Eat a soft diet for the next 24 hours.  FINDING OUT THE RESULTS OF YOUR TEST Not all test results are available during your visit. If your test results are not back during the visit, make an appointment  with your caregiver to find out the results. Do not assume everything is normal if you have not heard from your caregiver or the medical facility. It is important for you to follow up on all of your test results.  SEEK IMMEDIATE MEDICAL ATTENTION IF:  You have more than a spotting of blood in your stool.   Your belly is swollen (abdominal distention).   You are nauseated or vomiting.   You have a temperature over 101.   You have abdominal pain or discomfort that is severe or gets worse throughout the day.    Colonoscopy was normal today.  I recommend you have a repeat screening colonoscopy in 10 years  At patient request, I spoke to [place] at [phone] and reviewed results

## 2019-03-21 NOTE — Op Note (Signed)
[facility] Patient Name: Katherine BorgVanessa [name] Procedure Date: 03/21/2019 9:10 AM MRN: [id number] Date of Birth: January 18, 1971 Attending MD: Gennette Pacobert Michael Hutchinson Isenberg , MD CSN: [id number] Age: [age] Admit Type: Outpatient Procedure:                Colonoscopy Indications:              Screening for colorectal malignant neoplasm Providers:                Gennette Pacobert Michael Dahlia Nifong, MD, Nena PolioLisa Moore, RN, Burke Keelsrisann                            Tilley, Technician Referring MD:              Medicines:                Midazolam 7 mg IV, Meperidine 50 mg IV Complications:            No immediate complications. Estimated Blood Loss:     Estimated blood loss: none. Procedure:                Pre-Anesthesia Assessment:                           - Prior to the procedure, a History and Physical                            was performed, and patient medications and                            allergies were reviewed. The patient's tolerance of                            previous anesthesia was also reviewed. The risks                            and benefits of the procedure and the sedation                            options and risks were discussed with the patient.                            All questions were answered, and informed consent                            was obtained. Prior Anticoagulants: The patient has                            taken no previous anticoagulant or antiplatelet                            agents. ASA Grade Assessment: II - A patient with                            mild systemic disease. After reviewing the risks  and benefits, the patient was deemed in                            satisfactory condition to undergo the procedure.                           After obtaining informed consent, the colonoscope                            was passed under direct vision. Throughout the                            procedure, the patient's blood pressure, pulse, and             oxygen saturations were monitored continuously. The                            [id number] ([id number]) scope was introduced through                            the anus and advanced to the the cecum, identified                            by appendiceal orifice and ileocecal valve. The                            colonoscopy was performed without difficulty. The                            patient tolerated the procedure well. The quality                            of the bowel preparation was adequate. The entire                            colon was examined. The entire colon was well                            visualized. Scope In: 9:36:15 AM Scope Out: 9:53:29 AM Scope Withdrawal Time: 0 hours 10 minutes 6 seconds  Total Procedure Duration: 0 hours 17 minutes 14 seconds  Findings:      The perianal and digital rectal examinations were normal.      The colon (entire examined portion) appeared normal. Impression:               - The entire examined colon is normal.                           - No specimens collected. Moderate Sedation:      Moderate (conscious) sedation was administered by the endoscopy nurse       and supervised by the endoscopist. The following parameters were       monitored: oxygen saturation, heart rate, blood pressure, and response       to care. Total physician intraservice time was 24 minutes. Recommendation:           -  Patient has a contact number available for                            emergencies. The signs and symptoms of potential                            delayed complications were discussed with the                            patient. Return to normal activities tomorrow.                            Written discharge instructions were provided to the                            patient.                           - Resume previous diet.                           - Continue present medications.                           - Repeat colonoscopy in 10 years  for screening                            purposes.                           - Return to GI office (date not yet determined). Procedure Code(s):        --- Professional ---                           [phone], Colonoscopy, flexible; diagnostic, including                            collection of specimen(s) by brushing or washing,                            when performed (separate procedure)                           99153, Moderate sedation; each additional 15                            minutes intraservice time                           G0500, Moderate sedation services provided by the                            same physician or other qualified health care                            professional performing a gastrointestinal  endoscopic service that sedation supports,                            requiring the presence of an independent trained                            observer to assist in the monitoring of the                            patient's level of consciousness and physiological                            status; initial 15 minutes of intra-service time;                            patient age 49 years or older (additional time may                            be reported with 16109, as appropriate) Diagnosis Code(s):        --- Professional ---                           Z12.11, Encounter for screening for malignant                            neoplasm of colon CPT copyright 2019 American Medical Association. All rights reserved. The codes documented in this report are preliminary and upon coder review may  be revised to meet current compliance requirements. Gerrit Friends. Kiandre Spagnolo, MD Gennette Pac, MD 03/21/2019 10:06:23 AM This report has been signed electronically. Number of Addenda: 0

## 2019-06-16 ENCOUNTER — Ambulatory Visit: Payer: BC Managed Care – PPO | Attending: Internal Medicine

## 2019-06-16 DIAGNOSIS — Z23 Encounter for immunization: Secondary | ICD-10-CM

## 2019-06-16 NOTE — Progress Notes (Signed)
   Covid-19 Vaccination Clinic  Name:  Katherine [name]    MRN: [id number] DOB: 06/25/1970  06/16/2019  Ms. [name] was observed post Covid-19 immunization for 15 minutes without incident. She was provided with Vaccine Information Sheet and instruction to access the V-Safe system.   Ms. [name] was instructed to call 911 with any severe reactions post vaccine: Marland Kitchen Difficulty breathing  . Swelling of face and throat  . A fast heartbeat  . A bad rash all over body  . Dizziness and weakness   Immunizations Administered    Name Date Dose VIS Date Route   Moderna COVID-19 Vaccine 06/16/2019 11:27 AM 0.5 mL 03/06/2019 Intramuscular   Manufacturer: Moderna   Lot: [id number]   NDC: 76151-834-37

## 2019-07-18 ENCOUNTER — Ambulatory Visit: Payer: BC Managed Care – PPO | Attending: Internal Medicine

## 2019-07-18 DIAGNOSIS — Z23 Encounter for immunization: Secondary | ICD-10-CM

## 2019-07-18 NOTE — Progress Notes (Signed)
   Covid-19 Vaccination Clinic  Name:  GUSTAVA BERLAND    MRN: [id number] DOB: 28-Mar-1971  07/18/2019  Ms. Sobecki was observed post Covid-19 immunization for 15 minutes without incident. She was provided with Vaccine Information Sheet and instruction to access the V-Safe system.   Ms. Obando was instructed to call 911 with any severe reactions post vaccine: Marland Kitchen Difficulty breathing  . Swelling of face and throat  . A fast heartbeat  . A bad rash all over body  . Dizziness and weakness   Immunizations Administered    Name Date Dose VIS Date Route   Moderna COVID-19 Vaccine 07/18/2019  9:04 AM 0.5 mL 03/06/2019 Intramuscular   Manufacturer: Moderna   Lot: [id number]   NDC: 10626-948-54

## 2019-07-20 ENCOUNTER — Other Ambulatory Visit (HOSPITAL_COMMUNITY): Payer: Self-pay | Admitting: Obstetrics and Gynecology

## 2019-07-20 DIAGNOSIS — Z1231 Encounter for screening mammogram for malignant neoplasm of breast: Secondary | ICD-10-CM

## 2019-08-27 ENCOUNTER — Ambulatory Visit (INDEPENDENT_AMBULATORY_CARE_PROVIDER_SITE_OTHER): Payer: BC Managed Care – PPO | Admitting: Obstetrics and Gynecology

## 2019-08-27 ENCOUNTER — Encounter: Payer: Self-pay | Admitting: Obstetrics and Gynecology

## 2019-08-27 VITALS — BP 108/66 | HR 57 | Ht 67.0 in | Wt 200.6 lb

## 2019-08-27 DIAGNOSIS — Z01419 Encounter for gynecological examination (general) (routine) without abnormal findings: Secondary | ICD-10-CM | POA: Diagnosis not present

## 2019-08-27 NOTE — Progress Notes (Signed)
PATIENT ID: Katherine [name], female     DOB: 25-Mar-1971, 49 y.o.     MRN: [id number]   [facility] Visit  08/27/19     PATIENT NAME: Katherine [name]     MRN [id number]     DOB: 1971/01/25  CC & HPI:  Katherine [name] is a 49 y.o. female presenting today for routine screening and physical.   She is scheduled for routine mammography on 09/05/2019. Her last mammogram on 08/14/2018 was normal.   She had a colonoscopy on 03/21/2019, and it was normal.   ROS:  Review of Systems  Constitutional: Negative for chills, diaphoresis, fever, malaise/fatigue and weight loss.  HENT: Negative for congestion and sore throat.   Eyes: Negative for blurred vision and double vision.  Respiratory: Negative for cough and shortness of breath.   Cardiovascular: Negative for chest pain, palpitations and leg swelling.  Gastrointestinal: Negative for constipation, diarrhea, heartburn, nausea and vomiting.  Genitourinary: Negative for frequency and urgency.  Musculoskeletal: Negative for back pain, falls and myalgias.  Skin: Negative for rash.  Neurological: Negative for dizziness, sensory change, weakness and headaches.  Endo/Heme/Allergies: Does not bruise/bleed easily.  Psychiatric/Behavioral: Negative for depression. The patient is not nervous/anxious.      Pertinent History Reviewed:  Reviewed: Significant for uterine ablation  Medical         Past Medical History:  Diagnosis Date  . Bronchitis   . Hypercholesteremia   . Low back pain without sciatica 05/28/2015  . Migraine   . Migraines   . Vitamin D deficiency 06/02/2015                              Surgical Hx:    Past Surgical History:  Procedure Laterality Date  . COLONOSCOPY N/A 03/21/2019   Procedure: COLONOSCOPY;  Surgeon: Daneil Dolin, MD;  Location: [facility];  Service: Endoscopy;  Laterality: N/A;  1:00  . ENDOMETRIAL ABLATION    . SHOULDER SURGERY    . TUBAL LIGATION     Medications: Reviewed & Updated - see associated  section                       Current Outpatient Medications:  .  aspirin EC 81 MG tablet, Take 81 mg by mouth daily., Disp: , Rfl:  .  Cholecalciferol (VITAMIN D3) 5000 units CAPS, Take 5,000 Units by mouth daily. , Disp: , Rfl:  .  fluticasone (FLONASE) 50 MCG/ACT nasal spray, Place 1 spray into both nostrils daily as needed for allergies or rhinitis. , Disp: , Rfl:  .  ibuprofen (ADVIL) 800 MG tablet, Take 800 mg by mouth every 8 (eight) hours as needed (pain). , Disp: , Rfl:  .  lovastatin (MEVACOR) 20 MG tablet, Take 20 mg by mouth at bedtime., Disp: , Rfl:  .  meclizine (ANTIVERT) 25 MG tablet, Take 1 tablet (25 mg total) by mouth 3 (three) times daily as needed for dizziness., Disp: 21 tablet, Rfl: 0 .  montelukast (SINGULAIR) 10 MG tablet, Take 10 mg by mouth at bedtime., Disp: , Rfl:  .  propranolol (INDERAL) 80 MG tablet, Take 160 mg by mouth daily. , Disp: , Rfl: 0 .  rizatriptan (MAXALT) 10 MG tablet, Take 10 mg by mouth as needed for migraine. , Disp: , Rfl: 0   Social History: Reviewed -  reports that she has never smoked. She has  never used smokeless tobacco.  Objective Findings:  Vitals: Blood pressure 108/66, pulse (!) 57, height 5\' 7"  (1.702 m), weight 200 lb 9.6 oz (91 kg).  PHYSICAL EXAMINATION General appearance - alert, well appearing, and in no distress, oriented to person, place, and time and normal appearing weight Mental status - alert, oriented to person, place, and time, normal mood, behavior, speech, dress, motor activity, and thought processes, affect appropriate to mood Chest - not examined Heart - not examined Abdomen - not examined Breasts - left breast normal without mass, skin or nipple changes or axillary nodes, right breast normal , good support bilaterally, risk and benefit of breast self-exam was discussed, 3-d vs screening mammogram vs diagnostic mammogram discussed. Skin - normal coloration and turgor, no rashes, no suspicious skin lesions  noted  PELVIC External genitalia - normal female Vulva - normal Vagina - normal, good secretions and support Cervix - tiny, well supported, mobile Uterus - tiny Adnexa - negative [place] - n/a Rectal - rectal exam not indicated colonoscopy recently done  Assessment & Plan:   A:  1.  Recommended routine screening pap q 5 yr from 2000  P:  1.  f/u mammogram on MyChart 2. Labs per md   By signing my name below, I, , attest that this documentation has been prepared under the direction and in the presence of [employer], MD. Electronically Signed: Tilda Burrow Medical Scribe. 08/27/19. 1:49 PM.  I personally performed the services described in this documentation, which was SCRIBED in my presence. The recorded information has been reviewed and considered accurate. It has been edited as necessary during review. 08/29/19, MD

## 2019-09-05 ENCOUNTER — Ambulatory Visit (HOSPITAL_COMMUNITY)
Admission: RE | Admit: 2019-09-05 | Discharge: 2019-09-05 | Disposition: A | Discharge status: Home / Self Care | Payer: BC Managed Care – PPO | Source: Ambulatory Visit | Attending: Obstetrics and Gynecology | Admitting: Obstetrics and Gynecology

## 2019-09-05 ENCOUNTER — Other Ambulatory Visit: Payer: Self-pay

## 2019-09-05 DIAGNOSIS — Z1231 Encounter for screening mammogram for malignant neoplasm of breast: Secondary | ICD-10-CM | POA: Diagnosis not present

## 2020-06-11 ENCOUNTER — Other Ambulatory Visit: Payer: Self-pay

## 2020-06-11 ENCOUNTER — Other Ambulatory Visit (HOSPITAL_COMMUNITY): Payer: Self-pay | Admitting: Family Medicine

## 2020-06-11 ENCOUNTER — Ambulatory Visit (HOSPITAL_COMMUNITY)
Admission: RE | Admit: 2020-06-11 | Discharge: 2020-06-11 | Disposition: A | Discharge status: Home / Self Care | Payer: BC Managed Care – PPO | Source: Ambulatory Visit | Attending: Family Medicine | Admitting: Family Medicine

## 2020-06-11 DIAGNOSIS — M542 Cervicalgia: Secondary | ICD-10-CM | POA: Insufficient documentation

## 2020-07-02 ENCOUNTER — Other Ambulatory Visit: Payer: Self-pay | Admitting: Family Medicine

## 2020-07-02 ENCOUNTER — Other Ambulatory Visit (HOSPITAL_COMMUNITY): Payer: Self-pay | Admitting: Family Medicine

## 2020-07-02 DIAGNOSIS — M542 Cervicalgia: Secondary | ICD-10-CM

## 2020-07-02 DIAGNOSIS — M5030 Other cervical disc degeneration, unspecified cervical region: Secondary | ICD-10-CM

## 2020-07-15 ENCOUNTER — Ambulatory Visit: Payer: Self-pay

## 2020-07-15 ENCOUNTER — Encounter: Payer: Self-pay | Admitting: Orthopaedic Surgery

## 2020-07-15 ENCOUNTER — Ambulatory Visit (INDEPENDENT_AMBULATORY_CARE_PROVIDER_SITE_OTHER): Payer: BC Managed Care – PPO | Admitting: Orthopaedic Surgery

## 2020-07-15 ENCOUNTER — Telehealth: Payer: Self-pay

## 2020-07-15 DIAGNOSIS — M25562 Pain in left knee: Secondary | ICD-10-CM

## 2020-07-15 DIAGNOSIS — M25561 Pain in right knee: Secondary | ICD-10-CM | POA: Diagnosis not present

## 2020-07-15 DIAGNOSIS — G8929 Other chronic pain: Secondary | ICD-10-CM | POA: Diagnosis not present

## 2020-07-15 MED ORDER — LIDOCAINE HCL 1 % IJ SOLN
3.0000 mL | INTRAMUSCULAR | Status: AC | PRN
  Administered 2020-07-15: 3 mL

## 2020-07-15 MED ORDER — METHYLPREDNISOLONE ACETATE 40 MG/ML IJ SUSP
40.0000 mg | INTRAMUSCULAR | Status: AC | PRN
  Administered 2020-07-15: 40 mg via INTRA_ARTICULAR

## 2020-07-15 NOTE — Telephone Encounter (Signed)
Noted  

## 2020-07-15 NOTE — Progress Notes (Signed)
Office Visit Note   Patient: Katherine [name]           Date of Birth: 25-Jul-1970           MRN: [id number] Visit Date: 07/15/2020              Requested by: Pearson Grippe, MD [address] [place],  [place] [address] PCP: Pearson Grippe, MD   Assessment & Plan: Visit Diagnoses:  1. Chronic pain of right knee   2. Chronic pain of left knee     Plan: We have recommended hyaluronic acid injections for her knee since they lasted for almost 5 years.  This is to treat the pain from mild arthritis in both knees.  To temporize her acute pain will write a steroid injection in both knees today while we wait for approval for hyaluronic acid in both knees.  I agree that this is necessary at this point given how successful he has been for 5 years.  She is also tried failed every form of conservative treatment as listed below.  She agrees with this treatment plan.  She will come back for follow-up once hyaluronic acid is approved and is can be placed in both knees.  Follow-Up Instructions: No follow-ups on file.   Orders:  Orders Placed This Encounter  Procedures  . Large Joint Inj  . Large Joint Inj  . XR Knee 1-2 Views Right  . XR Knee 1-2 Views Left   No orders of the defined types were placed in this encounter.     Procedures: Large Joint Inj: R knee on 07/15/2020 10:00 AM Indications: diagnostic evaluation and pain Details: 22 G 1.5 in needle, superolateral approach  Arthrogram: No  Medications: 3 mL lidocaine 1 %; 40 mg methylPREDNISolone acetate 40 MG/ML Outcome: tolerated well, no immediate complications Procedure, treatment alternatives, risks and benefits explained, specific risks discussed. Consent was given by the patient. Immediately prior to procedure a time out was called to verify the correct patient, procedure, equipment, support staff and site/side marked as required. Patient was prepped and draped in the usual sterile fashion.   Large Joint Inj: L knee on  07/15/2020 10:00 AM Indications: diagnostic evaluation and pain Details: 22 G 1.5 in needle, superolateral approach  Arthrogram: No  Medications: 3 mL lidocaine 1 %; 40 mg methylPREDNISolone acetate 40 MG/ML Outcome: tolerated well, no immediate complications Procedure, treatment alternatives, risks and benefits explained, specific risks discussed. Consent was given by the patient. Immediately prior to procedure a time out was called to verify the correct patient, procedure, equipment, support staff and site/side marked as required. Patient was prepped and draped in the usual sterile fashion.       Clinical Data: No additional findings.   Subjective: Chief Complaint  Patient presents with  . Left Knee - Pain  . Right Knee - Pain  The patient is someone I have not seen for about 5 years now.  She is had pain and swelling in both knees.  I last placed hyaluronic acid injections in both knees for mild arthritis about 5 years ago and she said the hip work greatly until about this past November when the pain returned to her knees.  She said no known injury.  She does work quite a bit.  She takes over-the-counter anti-inflammatories and worked on quad strengthening exercises and weight loss as well as uses ice for her knees.  There is no locking catching.  HPI  Review of Systems She  currently denies any headache, chest pain, shortness of breath, fever, chills, nausea, vomiting  Objective: Vital Signs: There were no vitals taken for this visit.  Physical Exam She is alert and orient x3 and in no acute distress Ortho Exam Both knees are ligamentously stable with good range of motion.  There is just a mild effusion of both knees today but nothing to really drain.  Both knees show patellofemoral irritation on exam. Specialty Comments:  No specialty comments available.  Imaging: XR Knee 1-2 Views Left  Result Date: 07/15/2020 2 views of the left knee show no acute findings.  There is  mild arthritic changes.  XR Knee 1-2 Views Right  Result Date: 07/15/2020 2 views of the right knee show no acute findings.  There is mild arthritic changes.    PMFS History: Patient Active Problem List   Diagnosis Date Noted  . Well woman exam with routine gynecological exam 11/24/2016  . Vitamin D deficiency 06/02/2015  . Low back pain without sciatica 05/28/2015   Past Medical History:  Diagnosis Date  . Bronchitis   . Hypercholesteremia   . Low back pain without sciatica 05/28/2015  . Migraine   . Migraines   . Vitamin D deficiency 06/02/2015    Family History  Problem Relation Age of Onset  . Dementia Mother   . Other Mother        issue with eyes  . Cancer Father        colon, kidney  . Colon cancer Father   . Hypertension Sister   . Other Son        frequent ear infections; optical nerve and optical cup enlarged  . Dementia Maternal Grandmother   . Alcohol abuse Maternal Grandfather   . Leukemia Paternal Grandmother   . Cancer Paternal Grandfather        lung  . Alcohol abuse Paternal Grandfather     Past Surgical History:  Procedure Laterality Date  . COLONOSCOPY N/A 03/21/2019   Procedure: COLONOSCOPY;  Surgeon: Corbin Ade, MD;  Location: [facility];  Service: Endoscopy;  Laterality: N/A;  1:00  . ENDOMETRIAL ABLATION    . SHOULDER SURGERY    . TUBAL LIGATION     Social History   Occupational History  . Not on file  Tobacco Use  . Smoking status: Never Smoker  . Smokeless tobacco: Never Used  Vaping Use  . Vaping Use: Never used  Substance and Sexual Activity  . Alcohol use: No  . Drug use: No  . Sexual activity: Not Currently    Birth control/protection: Surgical    Comment: tubal and ablation

## 2020-07-15 NOTE — Telephone Encounter (Signed)
Bilateral gel injections  

## 2020-07-17 ENCOUNTER — Ambulatory Visit (HOSPITAL_COMMUNITY): Payer: BC Managed Care – PPO

## 2020-07-17 ENCOUNTER — Encounter (HOSPITAL_COMMUNITY): Payer: Self-pay

## 2020-08-04 ENCOUNTER — Ambulatory Visit (HOSPITAL_COMMUNITY): Payer: BC Managed Care – PPO

## 2020-08-15 ENCOUNTER — Ambulatory Visit (HOSPITAL_COMMUNITY): Payer: BC Managed Care – PPO

## 2020-08-20 ENCOUNTER — Other Ambulatory Visit (HOSPITAL_COMMUNITY): Payer: Self-pay | Admitting: Internal Medicine

## 2020-08-20 DIAGNOSIS — Z1231 Encounter for screening mammogram for malignant neoplasm of breast: Secondary | ICD-10-CM

## 2020-08-27 ENCOUNTER — Telehealth: Payer: Self-pay | Admitting: Orthopaedic Surgery

## 2020-08-27 NOTE — Telephone Encounter (Signed)
Patient called asked for a call back concerning if the approval was received for the gel injection? (Bil Knee)  The number to contact patient is [phone]

## 2020-08-28 ENCOUNTER — Telehealth: Payer: Self-pay

## 2020-08-28 NOTE — Telephone Encounter (Signed)
Talked with patient concerning gel injection.  

## 2020-08-28 NOTE — Telephone Encounter (Signed)
VOB has been submitted for SynviscOne, bilateral knee. Pending BV. 

## 2020-09-02 ENCOUNTER — Telehealth: Payer: Self-pay

## 2020-09-02 NOTE — Telephone Encounter (Signed)
PA required for SynviscOne, bilateral knee. Submitted PA online through Covermymeds. Pending PA# [id number]

## 2020-09-03 ENCOUNTER — Telehealth: Payer: Self-pay

## 2020-09-03 NOTE — Telephone Encounter (Signed)
Approved for SynviscOne, bilateral knee. Buy & Bill Covered at 100% after Co-pay Co-pay of $80.00 required PA Approval# [id number] Valid 09/02/2020- 02/28/2021  Appt. 09/15/2020 with Dr. Magnus Ivan

## 2020-09-04 ENCOUNTER — Encounter (HOSPITAL_COMMUNITY): Payer: Self-pay

## 2020-09-04 ENCOUNTER — Ambulatory Visit (HOSPITAL_COMMUNITY): Payer: BC Managed Care – PPO

## 2020-09-08 ENCOUNTER — Other Ambulatory Visit: Payer: Self-pay

## 2020-09-08 ENCOUNTER — Ambulatory Visit (HOSPITAL_COMMUNITY)
Admission: RE | Admit: 2020-09-08 | Discharge: 2020-09-08 | Disposition: A | Discharge status: Home / Self Care | Payer: BC Managed Care – PPO | Source: Ambulatory Visit | Attending: Internal Medicine | Admitting: Internal Medicine

## 2020-09-08 DIAGNOSIS — Z1231 Encounter for screening mammogram for malignant neoplasm of breast: Secondary | ICD-10-CM | POA: Diagnosis not present

## 2020-09-10 ENCOUNTER — Ambulatory Visit (HOSPITAL_COMMUNITY)
Admission: RE | Admit: 2020-09-10 | Discharge: 2020-09-10 | Disposition: A | Discharge status: Home / Self Care | Payer: BC Managed Care – PPO | Source: Ambulatory Visit | Attending: Family Medicine | Admitting: Family Medicine

## 2020-09-10 DIAGNOSIS — M5030 Other cervical disc degeneration, unspecified cervical region: Secondary | ICD-10-CM

## 2020-09-10 DIAGNOSIS — M542 Cervicalgia: Secondary | ICD-10-CM | POA: Insufficient documentation

## 2020-09-15 ENCOUNTER — Ambulatory Visit: Payer: BC Managed Care – PPO | Admitting: Orthopaedic Surgery

## 2020-09-15 ENCOUNTER — Encounter: Payer: Self-pay | Admitting: Orthopaedic Surgery

## 2020-09-15 DIAGNOSIS — M1711 Unilateral primary osteoarthritis, right knee: Secondary | ICD-10-CM | POA: Diagnosis not present

## 2020-09-15 DIAGNOSIS — M1712 Unilateral primary osteoarthritis, left knee: Secondary | ICD-10-CM

## 2020-09-15 MED ORDER — HYLAN G-F 20 48 MG/6ML IX SOSY
48.0000 mg | PREFILLED_SYRINGE | INTRA_ARTICULAR | Status: AC | PRN
  Administered 2020-09-15: 48 mg via INTRA_ARTICULAR

## 2020-09-15 NOTE — Progress Notes (Signed)
   Procedure Note  Patient: Katherine [name]             Date of Birth: 1970/07/13           MRN: [id number]             Visit Date: 09/15/2020  Procedures: Visit Diagnoses:  1. Unilateral primary osteoarthritis, left knee   2. Unilateral primary osteoarthritis, right knee     Large Joint Inj: R knee on 09/15/2020 9:34 AM Indications: pain and diagnostic evaluation Details: 22 G 1.5 in needle, superolateral approach  Arthrogram: No  Medications: 48 mg Hylan 48 MG/6ML Outcome: tolerated well, no immediate complications Procedure, treatment alternatives, risks and benefits explained, specific risks discussed. Consent was given by the patient. Immediately prior to procedure a time out was called to verify the correct patient, procedure, equipment, support staff and site/side marked as required. Patient was prepped and draped in the usual sterile fashion.    Large Joint Inj: L knee on 09/15/2020 9:34 AM Indications: pain and diagnostic evaluation Details: 22 G 1.5 in needle, superolateral approach  Arthrogram: No  Medications: 48 mg Hylan 48 MG/6ML Outcome: tolerated well, no immediate complications Procedure, treatment alternatives, risks and benefits explained, specific risks discussed. Consent was given by the patient. Immediately prior to procedure a time out was called to verify the correct patient, procedure, equipment, support staff and site/side marked as required. Patient was prepped and draped in the usual sterile fashion.   The patient comes in today for scheduled hyaluronic acid injections in both knees with Synvisc 1 to treat the pain from osteoarthritis.  She had these type of injections before about 5 years ago and have lasted a very long time.  This is what helped her the most.  Steroids have not helped.  She is to continue to work on activity modification and quad strengthening exercises as well.  She has had no acute change in her medical status.  Neither knee has an  effusion today.  I did place Synvisc 1 in both knees today without difficulty.  All questions and concerns were answered and addressed.  Follow-up is as needed.

## 2021-05-01 ENCOUNTER — Ambulatory Visit (INDEPENDENT_AMBULATORY_CARE_PROVIDER_SITE_OTHER): Payer: BC Managed Care – PPO | Admitting: Adult Health

## 2021-05-01 ENCOUNTER — Encounter: Payer: Self-pay | Admitting: Adult Health

## 2021-05-01 ENCOUNTER — Other Ambulatory Visit: Payer: Self-pay

## 2021-05-01 ENCOUNTER — Other Ambulatory Visit (HOSPITAL_COMMUNITY)
Admission: RE | Admit: 2021-05-01 | Discharge: 2021-05-01 | Disposition: A | Discharge status: Home / Self Care | Payer: BC Managed Care – PPO | Source: Ambulatory Visit | Attending: Adult Health | Admitting: Adult Health

## 2021-05-01 VITALS — BP 103/61 | HR 68 | Ht 67.0 in | Wt 191.0 lb

## 2021-05-01 DIAGNOSIS — Z01419 Encounter for gynecological examination (general) (routine) without abnormal findings: Secondary | ICD-10-CM

## 2021-05-01 DIAGNOSIS — Z1211 Encounter for screening for malignant neoplasm of colon: Secondary | ICD-10-CM

## 2021-05-01 LAB — HEMOCCULT GUIAC POC 1CARD (OFFICE): Fecal Occult Blood, POC: NEGATIVE

## 2021-05-01 NOTE — Progress Notes (Signed)
Patient ID: Katherine [name], female   DOB: December 02, 1970, 51 y.o.   MRN: [id number] History of Present Illness: Frankie is a 51 year old black female,single with female partner, [id number], sp ablation, in for well woman gyn exam and pap. PCP is [facility].   Current Medications, Allergies, Past Medical History, Past Surgical History, Family History and Social History were reviewed in [employer] record.     Review of Systems:  Patient denies any headaches, hearing loss, fatigue, blurred vision, shortness of breath, chest pain, abdominal pain, problems with bowel movements, urination, or intercourse. No joint pain or mood swings.  Has some hot flashes Denies any bleeding   Physical Exam:BP 103/61 (BP Location: Left Arm, Patient Position: Sitting, Cuff Size: Normal)    Pulse 68    Ht 5\' 7"  (1.702 m)    Wt 191 lb (86.6 kg)    LMP  (LMP Unknown)    BMI 29.91 kg/m   General:  Well developed, well nourished, no acute distress Skin:  Warm and dry Neck:  Midline trachea, normal thyroid, good ROM, no lymphadenopathy Lungs; Clear to auscultation bilaterally Breast:  No dominant palpable mass, retraction, or nipple discharge Cardiovascular: Regular rate and rhythm Abdomen:  Soft, non tender, no hepatosplenomegaly Pelvic:  External genitalia is normal in appearance, no lesions.  The vagina is normal in appearance. Urethra has no lesions or masses. The cervix is smooth, pap with HR HPV genotyping performed.  Uterus is felt to be normal size, shape, and contour.  No adnexal masses or tenderness noted.Bladder is non tender, no masses felt. Rectal: Good sphincter tone, no polyps, or hemorrhoids felt.  Hemoccult negative. Extremities/musculoskeletal:  No swelling or varicosities noted, no clubbing or cyanosis Psych:  No mood changes, alert and cooperative,seems happy AA is 4 Fall risk is low Depression screen [facility] 2/9 05/01/2021 08/27/2019 08/27/2019  Decreased Interest 0 0 0  Down,  Depressed, Hopeless 0 1 0  PHQ - 2 Score 0 1 0  Altered sleeping 0 1 -  Tired, decreased energy 1 1 -  Change in appetite 0 0 -  Feeling bad or failure about yourself  0 0 -  Trouble concentrating 0 0 -  Moving slowly or fidgety/restless 0 0 -  Suicidal thoughts 0 0 -  PHQ-9 Score 1 3 -  Difficult doing work/chores - Not difficult at all -    GAD 7 : Generalized Anxiety Score 05/01/2021 08/27/2019  Nervous, Anxious, on Edge 1 0  Control/stop worrying 2 1  Worry too much - different things 2 1  Trouble relaxing 2 1  Restless 2 1  Easily annoyed or irritable 3 2  Afraid - awful might happen 0 0  Total GAD 7 Score 12 6  Anxiety Difficulty - Not difficult at all  Has vistaril   Upstream - 05/01/21 1156       Pregnancy Intention Screening   Does the patient want to become pregnant in the next year? N/A    Does the patient's partner want to become pregnant in the next year? N/A    Would the patient like to discuss contraceptive options today? N/A      Contraception Wrap Up   Current Method Female Sterilization    End Method Female Sterilization    Contraception Counseling Provided No            Examination chaperoned by 05/03/21 RN    Impression and Plan: 1. Encounter for gynecological examination with Papanicolaou smear  of cervix Pap sent Physical with PCP Pap in 3 years if normal Mammogram every 1-2 years Colonoscopy per GI Labs with PCP  2. Encounter for screening fecal occult blood testing

## 2021-05-04 LAB — CYTOLOGY - PAP
Adequacy: ABSENT
Comment: NEGATIVE
Diagnosis: NEGATIVE
High risk HPV: NEGATIVE

## 2021-08-25 ENCOUNTER — Other Ambulatory Visit (HOSPITAL_COMMUNITY): Payer: Self-pay | Admitting: Internal Medicine

## 2021-08-25 DIAGNOSIS — Z1231 Encounter for screening mammogram for malignant neoplasm of breast: Secondary | ICD-10-CM

## 2021-09-10 ENCOUNTER — Ambulatory Visit (HOSPITAL_COMMUNITY)
Admission: RE | Admit: 2021-09-10 | Discharge: 2021-09-10 | Disposition: A | Discharge status: Home / Self Care | Payer: BC Managed Care – PPO | Source: Ambulatory Visit | Attending: Internal Medicine | Admitting: Internal Medicine

## 2021-09-10 DIAGNOSIS — Z1231 Encounter for screening mammogram for malignant neoplasm of breast: Secondary | ICD-10-CM | POA: Insufficient documentation

## 2022-10-21 ENCOUNTER — Other Ambulatory Visit (HOSPITAL_COMMUNITY): Payer: Self-pay | Admitting: Family Medicine

## 2022-10-21 DIAGNOSIS — Z1231 Encounter for screening mammogram for malignant neoplasm of breast: Secondary | ICD-10-CM

## 2022-10-25 ENCOUNTER — Ambulatory Visit (HOSPITAL_COMMUNITY): Payer: BC Managed Care – PPO

## 2022-10-28 ENCOUNTER — Ambulatory Visit (HOSPITAL_COMMUNITY)
Admission: RE | Admit: 2022-10-28 | Discharge: 2022-10-28 | Disposition: A | Discharge status: Home / Self Care | Payer: BLUE CROSS/BLUE SHIELD | Source: Ambulatory Visit | Attending: Family Medicine | Admitting: Family Medicine

## 2022-10-28 DIAGNOSIS — Z1231 Encounter for screening mammogram for malignant neoplasm of breast: Secondary | ICD-10-CM | POA: Insufficient documentation

## 2022-11-23 ENCOUNTER — Encounter: Payer: Self-pay | Admitting: Adult Health

## 2022-11-23 ENCOUNTER — Ambulatory Visit: Payer: BLUE CROSS/BLUE SHIELD | Admitting: Adult Health

## 2022-11-23 VITALS — BP 122/78 | HR 57 | Ht 67.0 in | Wt 199.0 lb

## 2022-11-23 DIAGNOSIS — R102 Pelvic and perineal pain: Secondary | ICD-10-CM

## 2022-11-23 DIAGNOSIS — Z01419 Encounter for gynecological examination (general) (routine) without abnormal findings: Secondary | ICD-10-CM

## 2022-11-23 DIAGNOSIS — Z1211 Encounter for screening for malignant neoplasm of colon: Secondary | ICD-10-CM

## 2022-11-23 LAB — HEMOCCULT GUIAC POC 1CARD (OFFICE): Fecal Occult Blood, POC: NEGATIVE

## 2022-11-23 NOTE — Progress Notes (Signed)
Patient ID: Katherine [name], female   DOB: 12-07-1970, 52 y.o.   MRN: [id number] History of Present Illness: Katherine [name] is a 52 year old black female, single, PM in for a well woman gyn exam. She had episode of pelvic pain for a month, is better now. She did not get it checked out due to no insurance.  She is working nights at [employer].    Current Medications, Allergies, Past Medical History, Past Surgical History, Family History and Social History were reviewed in [employer] record.     Review of Systems: Patient denies any headaches, hearing loss, fatigue, blurred vision, shortness of breath, chest pain, abdominal pain, problems with bowel movements, urination, or intercourse(has female partner). No joint pain or mood swings.  Had pelvic pain for a month, is better now Denies any vaginal bleeding   Physical Exam:BP 122/78 (BP Location: Left Arm, Patient Position: Sitting, Cuff Size: Normal)   Pulse (!) 57   Ht 5\' 7"  (1.702 m)   Wt 199 lb (90.3 kg)   BMI 31.17 kg/m   General:  Well developed, well nourished, no acute distress Skin:  Warm and dry Neck:  Midline trachea, normal thyroid, good ROM, no lymphadenopathy Lungs; Clear to auscultation bilaterally Breast:  No dominant palpable mass, retraction, or nipple discharge Cardiovascular: Regular rate and rhythm Abdomen:  Soft, non tender, no hepatosplenomegaly Pelvic:  External genitalia is normal in appearance, no lesions.  The vagina is normal in appearance. Urethra has no lesions or masses. The cervix is bulbous.  Uterus is felt to be normal size, shape, and contour.  No adnexal masses or tenderness noted.Bladder is non tender, no masses felt. Rectal: Good sphincter tone, no polyps, or hemorrhoids felt.  Hemoccult negative. Extremities/musculoskeletal:  No swelling or varicosities noted, no clubbing or cyanosis Psych:  No mood changes, alert and cooperative,seems happy AA is 4 Fall risk is low    11/23/2022   10:40  AM 05/01/2021   11:56 AM 08/27/2019    1:42 PM  Depression screen PHQ 2/9  Decreased Interest 0 0 0  Down, Depressed, Hopeless 0 0 1  PHQ - 2 Score 0 0 1  Altered sleeping 0 0 1  Tired, decreased energy 0 1 1  Change in appetite 0 0 0  Feeling bad or failure about yourself  0 0 0  Trouble concentrating 0 0 0  Moving slowly or fidgety/restless 0 0 0  Suicidal thoughts 0 0 0  PHQ-9 Score 0 1 3  Difficult doing work/chores   Not difficult at all       11/23/2022   10:41 AM 05/01/2021   11:56 AM 08/27/2019    1:42 PM  GAD 7 : Generalized Anxiety Score  Nervous, Anxious, on Edge 0 1 0  Control/stop worrying 0 2 1  Worry too much - different things 0 2 1  Trouble relaxing 0 2 1  Restless 0 2 1  Easily annoyed or irritable 0 3 2  Afraid - awful might happen 0 0 0  Total GAD 7 Score 0 12 6  Anxiety Difficulty   Not difficult at all      Upstream - 11/23/22 1046       Pregnancy Intention Screening   Does the patient want to become pregnant in the next year? N/A    Does the patient's partner want to become pregnant in the next year? N/A    Would the patient like to discuss contraceptive options today? N/A  Contraception Wrap Up   Current Method Female Sterilization    End Method Female Sterilization            Examination chaperoned by Malachy Mood LPN  Impression and Plan: 1. Encounter for well woman exam with routine gynecological exam Physical in 1 year Pap in 2026 Mammogram was negative 10/28/22 Labs with PCP Colonoscopy per GI   2. Encounter for screening fecal occult blood testing Hemoccult was negative  - POCT occult blood stool  3. Pelvic pain Had pelvic pain for a month, is better now Will get pelvic US 12/03/22 at 7:30 am at [facility] to assess uterus and ovaries  - US PELVIC COMPLETE WITH TRANSVAGINAL; Future

## 2022-11-24 ENCOUNTER — Telehealth: Payer: Self-pay | Admitting: Adult Health

## 2022-11-24 NOTE — Telephone Encounter (Signed)
Pt states she has changed her pharmacy to [facility].

## 2022-11-24 NOTE — Telephone Encounter (Signed)
Pharmacy changed to [facility] in [place]. JSY

## 2022-12-03 ENCOUNTER — Ambulatory Visit (HOSPITAL_COMMUNITY)
Admission: RE | Admit: 2022-12-03 | Discharge: 2022-12-03 | Disposition: A | Discharge status: Home / Self Care | Payer: BLUE CROSS/BLUE SHIELD | Source: Ambulatory Visit | Attending: Adult Health | Admitting: Adult Health

## 2022-12-03 DIAGNOSIS — R102 Pelvic and perineal pain: Secondary | ICD-10-CM | POA: Insufficient documentation

## 2023-02-01 IMAGING — MG MM DIGITAL SCREENING BILAT W/ TOMO AND CAD
6 of 10 series · 6 of 30 positions shown · non-contrast
Comparison: Previous exam(s).

CLINICAL DATA: Screening.

EXAM:
DIGITAL SCREENING BILATERAL MAMMOGRAM WITH TOMOSYNTHESIS AND CAD
TECHNIQUE: Bilateral screening digital craniocaudal and mediolateral oblique
mammograms were obtained. Bilateral screening digital breast
tomosynthesis was performed. The images were evaluated with
computer-aided detection.

[L MLO synth-2D (1 of 2)]
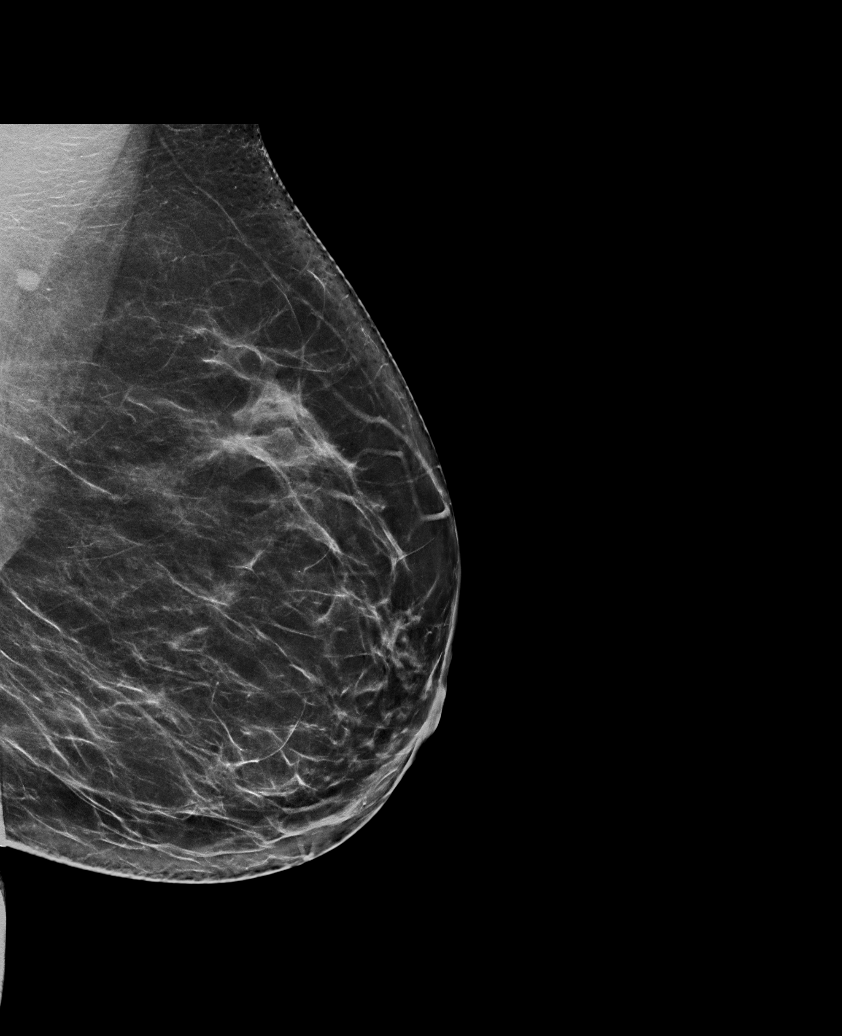

[L MLO synth-2D (2 of 2)]
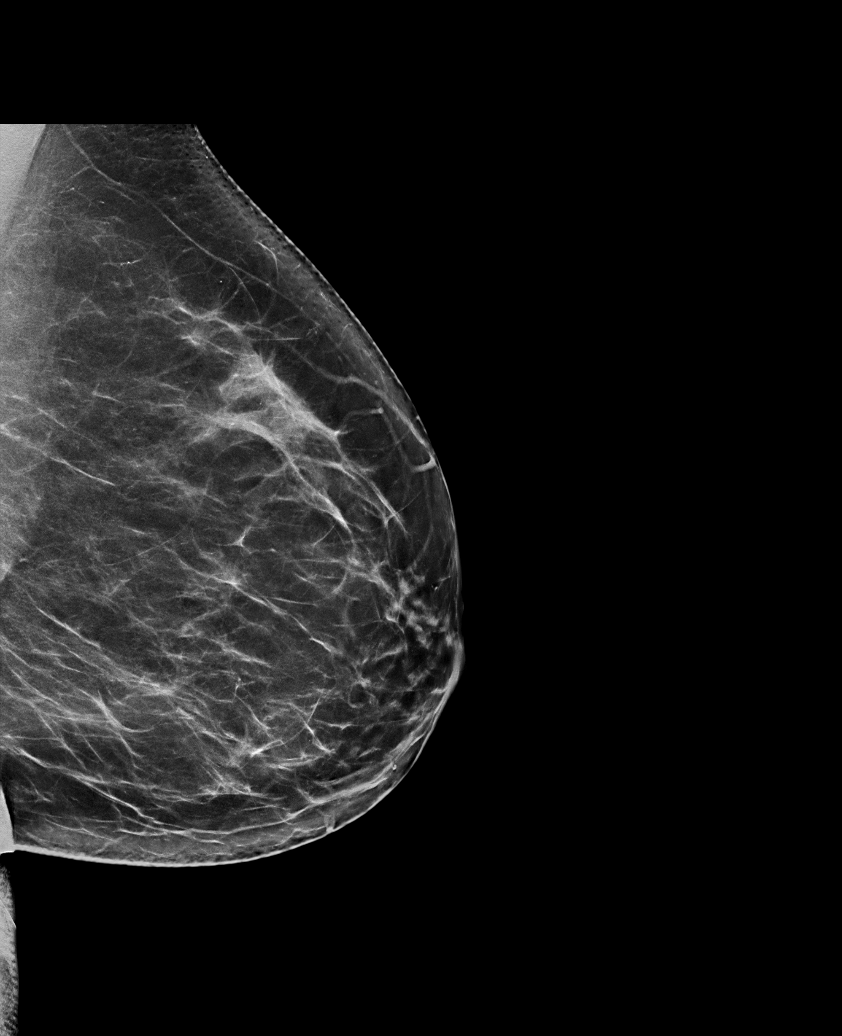

[L CC synth-2D]
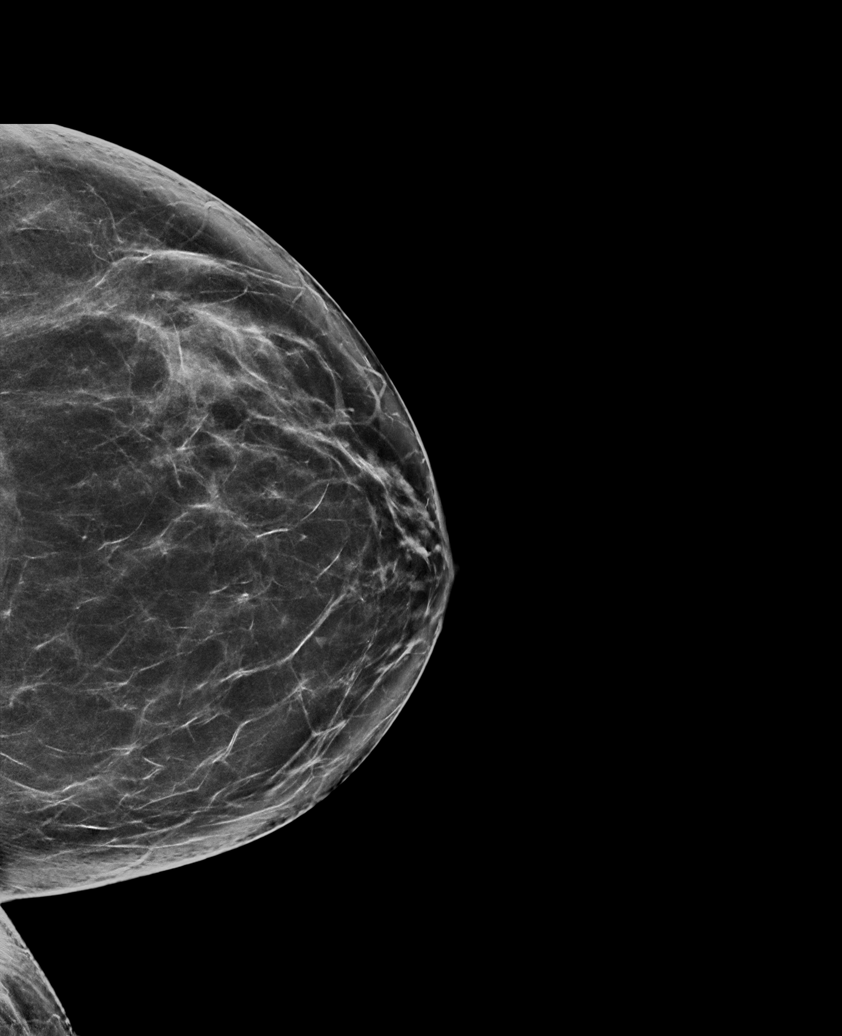

[R MLO synth-2D]
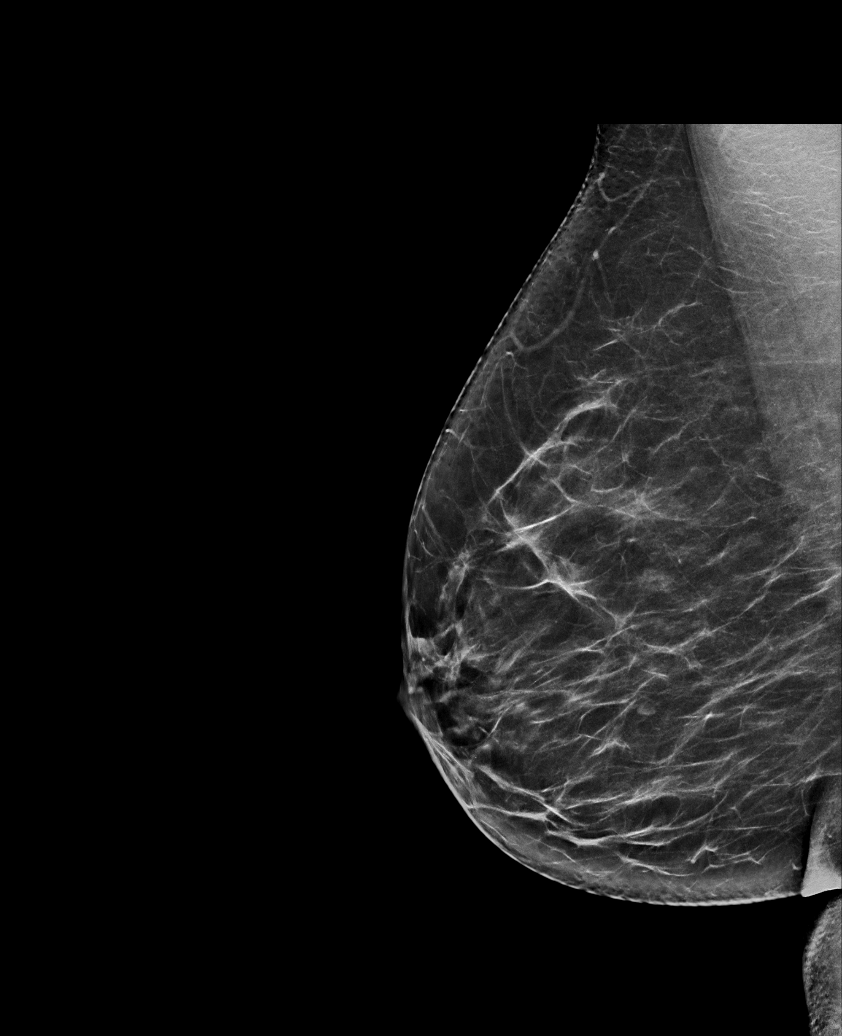

[R CC synth-2D]
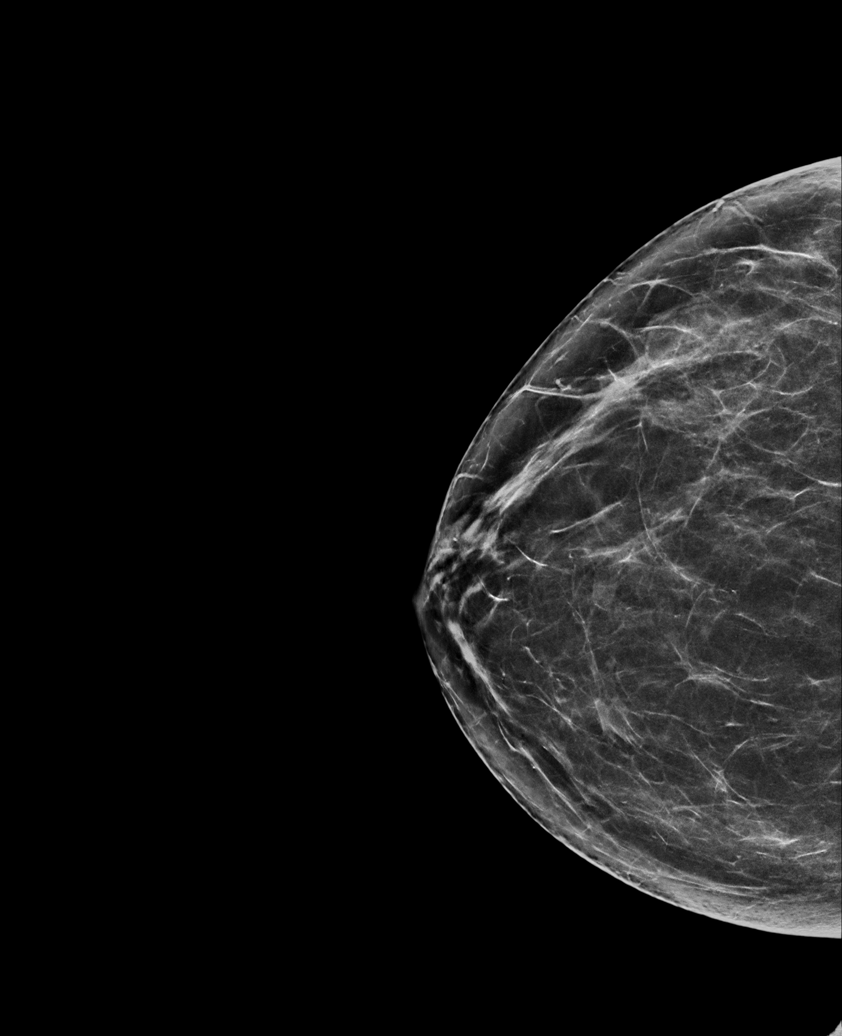

[R CC tomo · tomo slice 36/71.0]
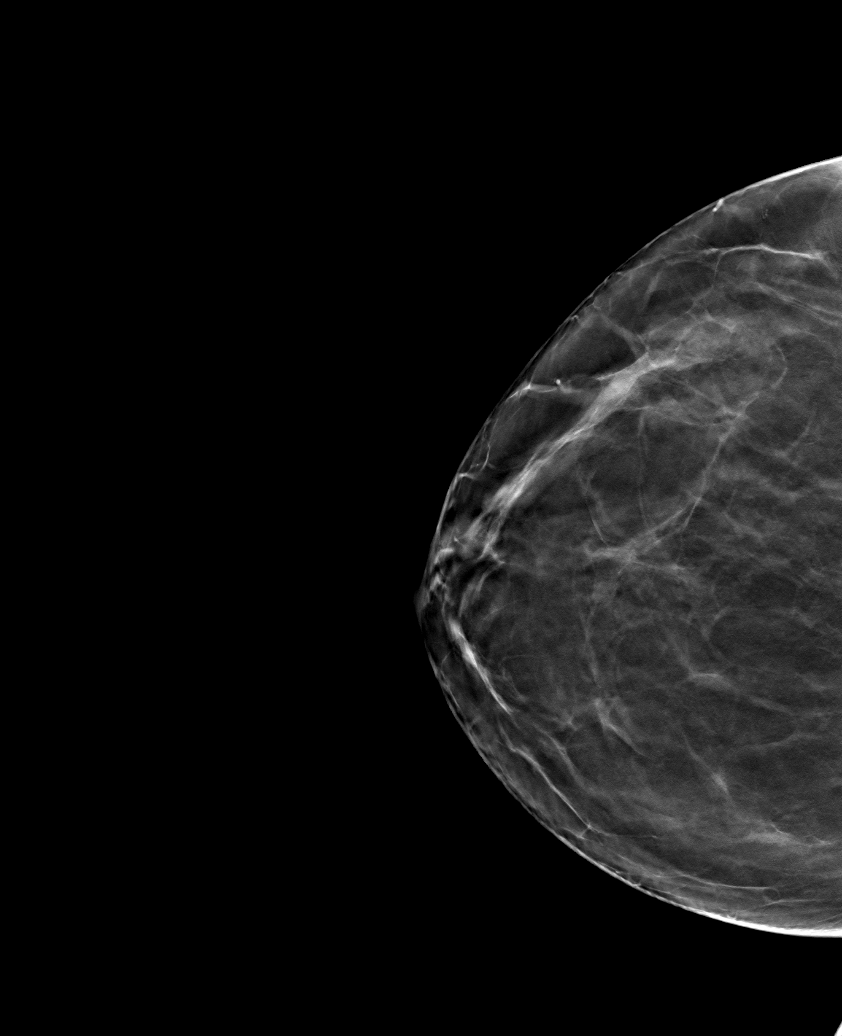

[6 of 30 positions shown; findings below may reference images not displayed]

ACR Breast Density Category b: There are scattered areas of
fibroglandular density.
FINDINGS: There are no findings suspicious for malignancy. The images were
evaluated with computer-aided detection.
IMPRESSION: No mammographic evidence of malignancy. A result letter of this
screening mammogram will be mailed directly to the patient.

RECOMMENDATION:
Screening mammogram in one year. (Code:WJ-I-BG6)

BI-RADS CATEGORY  1: Negative.

## 2023-05-12 ENCOUNTER — Ambulatory Visit: Payer: BLUE CROSS/BLUE SHIELD | Admitting: Adult Health

## 2023-05-12 ENCOUNTER — Encounter: Payer: Self-pay | Admitting: Adult Health

## 2023-05-12 VITALS — BP 134/74 | HR 64 | Ht 67.0 in | Wt 203.0 lb

## 2023-05-12 DIAGNOSIS — L0232 Furuncle of buttock: Secondary | ICD-10-CM

## 2023-05-12 MED ORDER — SILVER SULFADIAZINE 1 % EX CREA: 1.0000 | TOPICAL_CREAM | Freq: Two times a day (BID) | CUTANEOUS | 0 refills | Status: DC

## 2023-05-12 MED ORDER — SULFAMETHOXAZOLE-TRIMETHOPRIM 800-160 MG PO TABS: 1.0000 | ORAL_TABLET | Freq: Two times a day (BID) | ORAL | 0 refills | Status: DC

## 2023-05-12 NOTE — Progress Notes (Signed)
  Subjective:     Patient ID: Katherine [name], female   DOB: 09/01/1970, 53 y.o.   MRN: [id number]  HPI Katherine [name] is a 53 year old black female, single, [id number], in complaining of ?ingrown hairs, has shaved with electric razor.     Component Value Date/Time   DIAGPAP  05/01/2021 1211    - Negative for intraepithelial lesion or malignancy (NILM)   DIAGPAP  06/15/2018 0000    NEGATIVE FOR INTRAEPITHELIAL LESIONS OR MALIGNANCY.   HPVHIGH Negative 05/01/2021 1211   ADEQPAP  05/01/2021 1211    Satisfactory for evaluation; transformation zone component ABSENT.   ADEQPAP  06/15/2018 0000    Satisfactory for evaluation  endocervical/transformation zone component PRESENT.    PCP is Dr Luke   Review of Systems ?ingrown hairs, has shaved with electric razor.   Reviewed past medical,surgical, social and family history. Reviewed medications and allergies.  Objective:   Physical Exam BP 134/74 (BP Location: Left Arm, Patient Position: Sitting, Cuff Size: Normal)   Pulse 64   Ht 5' 7 (1.702 m)   Wt 203 lb (92.1 kg)   BMI 31.79 kg/m     Skin warm and dry.Pelvic: external genitalia is normal in appearance, has 2 small boils, right buttock, could have started out as ingrown hair, when pressure applied did express blood.  Fall risk is low  Upstream - 05/12/23 1116       Pregnancy Intention Screening   Does the patient want to become pregnant in the next year? No    Does the patient's partner want to become pregnant in the next year? No    Would the patient like to discuss contraceptive options today? No      Contraception Wrap Up   Current Method Female Sterilization    End Method Female Sterilization    Contraception Counseling Provided No            Examination chaperoned by Clarita Salt LPN  Assessment:     1. Boil of buttock (Primary) has 2 small boils, right buttock, could have started out as ingrown hair, when pressure applied did express blood.   Can use warm compress Do not  squeeze Will rx septra  ds and silvadene  Meds ordered this encounter  Medications   sulfamethoxazole -trimethoprim  (BACTRIM  DS) 800-160 MG tablet    Sig: Take 1 tablet by mouth 2 (two) times daily. Take 1 bid    Dispense:  14 tablet    Refill:  0    Supervising Provider:   JAYNE MINDER H [[id number]]   silver  sulfADIAZINE  (SILVADENE ) 1 % cream    Sig: Apply 1 Application topically 2 (two) times daily.    Dispense:  50 g    Refill:  0    Supervising Provider:   JAYNE MINDER H [[id number]]   Try to to shave as close  Plan:     Follow up prn

## 2023-08-24 ENCOUNTER — Encounter: Payer: Self-pay | Admitting: Orthopaedic Surgery

## 2023-08-24 ENCOUNTER — Ambulatory Visit: Admitting: Orthopaedic Surgery

## 2023-08-24 ENCOUNTER — Telehealth: Payer: Self-pay

## 2023-08-24 DIAGNOSIS — M1711 Unilateral primary osteoarthritis, right knee: Secondary | ICD-10-CM

## 2023-08-24 DIAGNOSIS — M25561 Pain in right knee: Secondary | ICD-10-CM | POA: Diagnosis not present

## 2023-08-24 DIAGNOSIS — G8929 Other chronic pain: Secondary | ICD-10-CM | POA: Diagnosis not present

## 2023-08-24 DIAGNOSIS — M25562 Pain in left knee: Secondary | ICD-10-CM

## 2023-08-24 DIAGNOSIS — M1712 Unilateral primary osteoarthritis, left knee: Secondary | ICD-10-CM

## 2023-08-24 NOTE — Telephone Encounter (Signed)
 Bilateral knee gel injections

## 2023-08-24 NOTE — Progress Notes (Signed)
 The patient is a 53 year old female with known bilateral knee moderate tricompartment arthritis.  The last time we saw her was in 2022.  She has had hyaluronic acid in the past as well as steroid injections.  The hyaluronic acid as well as helped her the most and she has had no relief from the steroid injections.  She has a constant ache in both knees that is more related to her osteoarthritis.  She is requesting hyaluronic acid again for her knees.  She has had no acute change in her medical status.  She has not had any surgery on her knees.  We are also not planning to proceed with any surgery until the failure of conservative treatment.  Examination of both knees today shows patellofemoral crepitation with medial joint line tenderness and slight varus malalignment.  Both knees are ligaments stable with good range of motion but pain throughout the arc of motion of the knees.  Neither knee has an effusion.  Again past x-rays of her knees shows moderate osteoarthritis with slight medial joint space narrowing and varus malalignment.  This point we will work on ordering hyaluronic acid for both knees which is absolutely medically appropriate given the effect this has had on her knees in the past and the fact that is lasting a long time and steroids have been of no help.  This patient is diagnosed with osteoarthritis of the knee(s).    Radiographs show evidence of joint space narrowing, osteophytes, subchondral sclerosis and/or subchondral cysts.  This patient has knee pain which interferes with functional and activities of daily living.    This patient has experienced inadequate response, adverse effects and/or intolerance with conservative treatments such as acetaminophen , NSAIDS, topical creams, physical therapy or regular exercise, knee bracing and/or weight loss.   This patient has experienced inadequate response or has a contraindication to intra articular steroid injections for at least 3 months.    This patient is not scheduled to have a total knee replacement within 6 months of starting treatment with viscosupplementation.

## 2023-09-07 NOTE — Telephone Encounter (Signed)
 VOB has been submitted for Monovisc, bilateral knee

## 2023-09-08 ENCOUNTER — Telehealth: Payer: Self-pay

## 2023-09-08 NOTE — Telephone Encounter (Signed)
 VOB submitted for Durolane, bilateral knee due to Monovisc not being a preferred product through patient's insurance.  Per Quintin Buckle BCBS authorization required for Durolane and has been approved. Auth.# [id number] Valid 09/08/2023- 09/06/2024

## 2023-09-30 ENCOUNTER — Other Ambulatory Visit: Payer: Self-pay

## 2023-09-30 DIAGNOSIS — M1712 Unilateral primary osteoarthritis, left knee: Secondary | ICD-10-CM

## 2023-09-30 DIAGNOSIS — M1711 Unilateral primary osteoarthritis, right knee: Secondary | ICD-10-CM

## 2023-10-05 ENCOUNTER — Encounter: Payer: Self-pay | Admitting: Orthopaedic Surgery

## 2023-10-05 ENCOUNTER — Ambulatory Visit: Admitting: Orthopaedic Surgery

## 2023-10-05 DIAGNOSIS — M1712 Unilateral primary osteoarthritis, left knee: Secondary | ICD-10-CM

## 2023-10-05 DIAGNOSIS — M170 Bilateral primary osteoarthritis of knee: Secondary | ICD-10-CM

## 2023-10-05 DIAGNOSIS — M1711 Unilateral primary osteoarthritis, right knee: Secondary | ICD-10-CM

## 2023-10-05 MED ORDER — METHYLPREDNISOLONE ACETATE 40 MG/ML IJ SUSP
40.0000 mg | INTRAMUSCULAR | Status: AC | PRN
Start: 2023-10-05 — End: 2023-10-05
  Administered 2023-10-05: 40 mg via INTRA_ARTICULAR

## 2023-10-05 MED ORDER — SODIUM HYALURONATE 60 MG/3ML IX PRSY
60.0000 mg | PREFILLED_SYRINGE | INTRA_ARTICULAR | Status: AC | PRN
Start: 2023-10-05 — End: 2023-10-05
  Administered 2023-10-05: 60 mg via INTRA_ARTICULAR

## 2023-10-05 NOTE — Progress Notes (Signed)
   Procedure Note  Patient: Katherine [name]             Date of Birth: [birth year]           MRN: [id number]             Visit Date: 10/05/2023  Procedures: Visit Diagnoses:  1. Unilateral primary osteoarthritis, left knee   2. Unilateral primary osteoarthritis, right knee     Large Joint Inj: R knee on 10/05/2023 9:09 AM Indications: diagnostic evaluation and pain Details: 22 G 1.5 in needle, superolateral approach  Arthrogram: No  Medications: 60 mg Sodium Hyaluronate 60 MG/3ML Outcome: tolerated well, no immediate complications Procedure, treatment alternatives, risks and benefits explained, specific risks discussed. Consent was given by the patient. Immediately prior to procedure a time out was called to verify the correct patient, procedure, equipment, support staff and site/side marked as required. Patient was prepped and draped in the usual sterile fashion.    Large Joint Inj: L knee on 10/05/2023 9:09 AM Indications: diagnostic evaluation and pain Details: 22 G 1.5 in needle, superolateral approach  Arthrogram: No  Medications: 40 mg methylPREDNISolone  acetate 40 MG/ML; 60 mg Sodium Hyaluronate 60 MG/3ML Outcome: tolerated well, no immediate complications Procedure, treatment alternatives, risks and benefits explained, specific risks discussed. Consent was given by the patient. Immediately prior to procedure a time out was called to verify the correct patient, procedure, equipment, support staff and site/side marked as required. Patient was prepped and draped in the usual sterile fashion.    The patient is well-known to us .  She is a young 53 year old female with bilateral knee osteoarthritis.  She has had excellent response to hyaluronic acid in the past for both knees.  It was last in 2022 when she had these injections.  She has had no acute change in her medical status.  She has good range of motion of both knees with global tenderness from her osteoarthritis.  We did place  Durolane in both knees today which she tolerated well.  She knows to wait at least 6 months between these types of injections.  She will still work on Dance movement psychotherapist and activity modification.

## 2023-10-17 ENCOUNTER — Other Ambulatory Visit (HOSPITAL_COMMUNITY): Payer: Self-pay | Admitting: Family Medicine

## 2023-10-17 DIAGNOSIS — Z1231 Encounter for screening mammogram for malignant neoplasm of breast: Secondary | ICD-10-CM

## 2023-11-02 ENCOUNTER — Ambulatory Visit (HOSPITAL_COMMUNITY)
Admission: RE | Admit: 2023-11-02 | Discharge: 2023-11-02 | Disposition: A | Discharge status: Home / Self Care | Source: Ambulatory Visit | Attending: Family Medicine | Admitting: Family Medicine

## 2023-11-02 DIAGNOSIS — Z1231 Encounter for screening mammogram for malignant neoplasm of breast: Secondary | ICD-10-CM | POA: Insufficient documentation

## 2023-12-19 ENCOUNTER — Encounter: Payer: Self-pay | Admitting: Adult Health

## 2023-12-19 ENCOUNTER — Ambulatory Visit: Admitting: Adult Health

## 2023-12-19 VITALS — BP 122/82 | HR 76 | Ht 67.0 in | Wt 201.5 lb

## 2023-12-19 DIAGNOSIS — N950 Postmenopausal bleeding: Secondary | ICD-10-CM | POA: Diagnosis not present

## 2023-12-19 DIAGNOSIS — Z01419 Encounter for gynecological examination (general) (routine) without abnormal findings: Secondary | ICD-10-CM | POA: Diagnosis not present

## 2023-12-19 DIAGNOSIS — Z1211 Encounter for screening for malignant neoplasm of colon: Secondary | ICD-10-CM

## 2023-12-19 LAB — HEMOCCULT GUIAC POC 1CARD (OFFICE): Fecal Occult Blood, POC: NEGATIVE

## 2023-12-19 NOTE — Progress Notes (Signed)
 Patient ID: LOYD SALVADOR, female   DOB: 07/29/1970, 53 y.o.   MRN: [id number] History of Present Illness: Divina is a 53 year old black female,single, PM in for a well woman gyn exam. She had some cramping in February and bleed pink like one day, none since. She works nights at [employer].     Component Value Date/Time   DIAGPAP  05/01/2021 1211    - Negative for intraepithelial lesion or malignancy (NILM)   DIAGPAP  06/15/2018 0000    NEGATIVE FOR INTRAEPITHELIAL LESIONS OR MALIGNANCY.   HPVHIGH Negative 05/01/2021 1211   ADEQPAP  05/01/2021 1211    Satisfactory for evaluation; transformation zone component ABSENT.   ADEQPAP  06/15/2018 0000    Satisfactory for evaluation  endocervical/transformation zone component PRESENT.    PCP is [facility]  Current Medications, Allergies, Past Medical History, Past Surgical History, Family History and Social History were reviewed in [employer] record.     Review of Systems: Patient denies any headaches, hearing loss, fatigue, blurred vision, shortness of breath, chest pain, abdominal pain, problems with bowel movements, urination, or intercourse. No joint pain or mood swings.  Had cramping in February and bleed pink like one day, none since. She have heart burn and vomited after eating cereal this morning, better now   Physical Exam:BP 122/82 (BP Location: Left Arm, Patient Position: Sitting, Cuff Size: Normal)   Pulse 76   Ht 5' 7 (1.702 m)   Wt 201 lb 8 oz (91.4 kg)   BMI 31.56 kg/m   General:  Well developed, well nourished, no acute distress Skin:  Warm and dry Neck:  Midline trachea, normal thyroid, good ROM, no lymphadenopathy Lungs; Clear to auscultation bilaterally Breast:  No dominant palpable mass, retraction, or nipple discharge Cardiovascular: Regular rate and rhythm Abdomen:  Soft, non tender, no hepatosplenomegaly Pelvic:  External genitalia is normal in appearance, no lesions.  The vagina is  normal in appearance. Urethra has no lesions or masses. The cervix is smooth.  Uterus is felt to be normal size, shape, and contour.  No adnexal masses or tenderness noted.Bladder is non tender, no masses felt. Rectal: Good sphincter tone, no polyps, or hemorrhoids felt.  Hemoccult negative. Extremities/musculoskeletal:  No swelling or varicosities noted, no clubbing or cyanosis Psych:  No mood changes, alert and cooperative,seems happy AA is 2 Fall risk is low    12/19/2023   10:31 AM 11/23/2022   10:40 AM 05/01/2021   11:56 AM  Depression screen PHQ 2/9  Decreased Interest 0 0 0  Down, Depressed, Hopeless 0 0 0  PHQ - 2 Score 0 0 0  Altered sleeping 0 0 0  Tired, decreased energy 3 0 1  Change in appetite 0 0 0  Feeling bad or failure about yourself  0 0 0  Trouble concentrating 0 0 0  Moving slowly or fidgety/restless 0 0 0  Suicidal thoughts 0 0 0  PHQ-9 Score 3 0 1       12/19/2023   10:32 AM 11/23/2022   10:41 AM 05/01/2021   11:56 AM 08/27/2019    1:42 PM  GAD 7 : Generalized Anxiety Score  Nervous, Anxious, on Edge 0 0 1 0  Control/stop worrying 0 0 2 1  Worry too much - different things 0 0 2 1  Trouble relaxing 0 0 2 1  Restless 0 0 2 1  Easily annoyed or irritable 1 0 3 2  Afraid - awful might happen 0  0 0 0  Total GAD 7 Score 1 0 12 6  Anxiety Difficulty    Not difficult at all      Upstream - 12/19/23 1028       Pregnancy Intention Screening   Does the patient want to become pregnant in the next year? N/A    Does the patient's partner want to become pregnant in the next year? N/A    Would the patient like to discuss contraceptive options today? N/A      Contraception Wrap Up   Current Method Female Sterilization    End Method Female Sterilization    Contraception Counseling Provided No          Examination chaperoned by Clarita Salt LPN  Impression and plan: 1. Encounter for well woman exam with routine gynecological exam (Primary) Pap and physical  in 1 year Labs with PCP Mammogram was negative 11/02/23 Colonoscopy per GI   2. Encounter for screening fecal occult blood testing Hemoccult was negative   3. PMB (postmenopausal bleeding) Had cramping in February and bleed pink like one day, none since. Will get pelvic US  in office to assess endometrial lining and talk when results back  - US  PELVIC COMPLETE WITH TRANSVAGINAL; Future

## 2024-01-18 ENCOUNTER — Ambulatory Visit (INDEPENDENT_AMBULATORY_CARE_PROVIDER_SITE_OTHER)

## 2024-01-18 ENCOUNTER — Ambulatory Visit: Payer: Self-pay | Admitting: Adult Health

## 2024-01-18 DIAGNOSIS — N83331 Acquired atrophy of right ovary and fallopian tube: Secondary | ICD-10-CM | POA: Diagnosis not present

## 2024-01-18 DIAGNOSIS — N950 Postmenopausal bleeding: Secondary | ICD-10-CM

## 2024-01-18 DIAGNOSIS — N83332 Acquired atrophy of left ovary and fallopian tube: Secondary | ICD-10-CM

## 2024-01-18 NOTE — Progress Notes (Signed)
 PELVIC US  TA/TV: atrophic heterogeneous retroverted uterus,WNL,normal ovaries,ovaries appear mobile,EEC 1.9 mm,trace of fluid within the endometrium,no free fluid  Chaperone Whitney

## 2024-02-06 ENCOUNTER — Encounter: Payer: Self-pay | Admitting: Radiology
